# Patient Record
Sex: Female | Born: 1975 | Race: White | Hispanic: No | Marital: Married | Smoking: Never smoker
Health system: Southern US, Community
[De-identification: ages and names within clinical notes are randomized; demographics above are authoritative.]

## PROBLEM LIST (undated history)

## (undated) DIAGNOSIS — C50919 Malignant neoplasm of unspecified site of unspecified female breast: Secondary | ICD-10-CM

## (undated) DIAGNOSIS — J45909 Unspecified asthma, uncomplicated: Secondary | ICD-10-CM

## (undated) DIAGNOSIS — C801 Malignant (primary) neoplasm, unspecified: Secondary | ICD-10-CM

## (undated) DIAGNOSIS — Z800 Family history of malignant neoplasm of digestive organs: Secondary | ICD-10-CM

## (undated) DIAGNOSIS — J189 Pneumonia, unspecified organism: Secondary | ICD-10-CM

## (undated) DIAGNOSIS — Z808 Family history of malignant neoplasm of other organs or systems: Secondary | ICD-10-CM

## (undated) DIAGNOSIS — Z803 Family history of malignant neoplasm of breast: Secondary | ICD-10-CM

## (undated) HISTORY — DX: Family history of malignant neoplasm of breast: Z80.3

## (undated) HISTORY — DX: Family history of malignant neoplasm of digestive organs: Z80.0

## (undated) HISTORY — DX: Family history of malignant neoplasm of other organs or systems: Z80.8

## (undated) HISTORY — PX: WISDOM TOOTH EXTRACTION: SHX21

---

## 2001-10-02 HISTORY — PX: APPENDECTOMY: SHX54

## 2015-11-04 ENCOUNTER — Other Ambulatory Visit: Payer: Self-pay

## 2015-11-04 DIAGNOSIS — Z1231 Encounter for screening mammogram for malignant neoplasm of breast: Secondary | ICD-10-CM

## 2015-11-26 ENCOUNTER — Ambulatory Visit
Admission: RE | Admit: 2015-11-26 | Discharge: 2015-11-26 | Disposition: A | Discharge status: Home / Self Care | Payer: Managed Care, Other (non HMO) | Source: Ambulatory Visit

## 2015-11-26 DIAGNOSIS — Z1231 Encounter for screening mammogram for malignant neoplasm of breast: Secondary | ICD-10-CM

## 2016-10-02 HISTORY — PX: AUGMENTATION MAMMAPLASTY: SUR837

## 2016-10-02 HISTORY — PX: MASTECTOMY: SHX3

## 2017-02-21 ENCOUNTER — Other Ambulatory Visit: Payer: Self-pay | Admitting: Family Medicine

## 2017-02-21 DIAGNOSIS — Z1231 Encounter for screening mammogram for malignant neoplasm of breast: Secondary | ICD-10-CM

## 2017-03-12 ENCOUNTER — Ambulatory Visit: Payer: Managed Care, Other (non HMO)

## 2017-03-30 ENCOUNTER — Ambulatory Visit
Admission: RE | Admit: 2017-03-30 | Discharge: 2017-03-30 | Disposition: A | Discharge status: Home / Self Care | Payer: Managed Care, Other (non HMO) | Source: Ambulatory Visit | Attending: Family Medicine | Admitting: Family Medicine

## 2017-03-30 DIAGNOSIS — Z1231 Encounter for screening mammogram for malignant neoplasm of breast: Secondary | ICD-10-CM

## 2017-04-03 ENCOUNTER — Other Ambulatory Visit: Payer: Self-pay | Admitting: Family Medicine

## 2017-04-03 DIAGNOSIS — R928 Other abnormal and inconclusive findings on diagnostic imaging of breast: Secondary | ICD-10-CM

## 2017-04-09 ENCOUNTER — Other Ambulatory Visit: Payer: Self-pay | Admitting: Family Medicine

## 2017-04-09 ENCOUNTER — Ambulatory Visit
Admission: RE | Admit: 2017-04-09 | Discharge: 2017-04-09 | Disposition: A | Discharge status: Home / Self Care | Payer: Managed Care, Other (non HMO) | Source: Ambulatory Visit | Attending: Family Medicine | Admitting: Family Medicine

## 2017-04-09 DIAGNOSIS — N6320 Unspecified lump in the left breast, unspecified quadrant: Secondary | ICD-10-CM

## 2017-04-09 DIAGNOSIS — R928 Other abnormal and inconclusive findings on diagnostic imaging of breast: Secondary | ICD-10-CM

## 2017-04-10 ENCOUNTER — Ambulatory Visit
Admission: RE | Admit: 2017-04-10 | Discharge: 2017-04-10 | Disposition: A | Discharge status: Home / Self Care | Payer: Managed Care, Other (non HMO) | Source: Ambulatory Visit | Attending: Family Medicine | Admitting: Family Medicine

## 2017-04-10 ENCOUNTER — Other Ambulatory Visit: Payer: Self-pay | Admitting: Family Medicine

## 2017-04-10 DIAGNOSIS — N6320 Unspecified lump in the left breast, unspecified quadrant: Secondary | ICD-10-CM

## 2017-04-12 ENCOUNTER — Other Ambulatory Visit: Payer: Self-pay | Admitting: Family Medicine

## 2017-04-12 DIAGNOSIS — N6489 Other specified disorders of breast: Secondary | ICD-10-CM

## 2017-04-13 ENCOUNTER — Encounter: Payer: Self-pay | Admitting: Radiation Oncology

## 2017-04-13 ENCOUNTER — Other Ambulatory Visit: Payer: Managed Care, Other (non HMO)

## 2017-04-13 ENCOUNTER — Ambulatory Visit
Admission: RE | Admit: 2017-04-13 | Discharge: 2017-04-13 | Disposition: A | Discharge status: Home / Self Care | Payer: Managed Care, Other (non HMO) | Source: Ambulatory Visit | Attending: Radiation Oncology | Admitting: Radiation Oncology

## 2017-04-13 ENCOUNTER — Ambulatory Visit (HOSPITAL_BASED_OUTPATIENT_CLINIC_OR_DEPARTMENT_OTHER): Payer: Managed Care, Other (non HMO) | Admitting: Hematology and Oncology

## 2017-04-13 ENCOUNTER — Encounter: Payer: Self-pay | Admitting: Hematology and Oncology

## 2017-04-13 ENCOUNTER — Telehealth: Payer: Self-pay | Admitting: *Deleted

## 2017-04-13 VITALS — BP 120/77 | HR 50 | Temp 98.3°F | Resp 18 | Ht 66.0 in | Wt 132.5 lb

## 2017-04-13 DIAGNOSIS — C50912 Malignant neoplasm of unspecified site of left female breast: Secondary | ICD-10-CM | POA: Insufficient documentation

## 2017-04-13 DIAGNOSIS — C50412 Malignant neoplasm of upper-outer quadrant of left female breast: Secondary | ICD-10-CM | POA: Insufficient documentation

## 2017-04-13 DIAGNOSIS — D0512 Intraductal carcinoma in situ of left breast: Secondary | ICD-10-CM

## 2017-04-13 NOTE — Assessment & Plan Note (Signed)
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 It is considered a precancerous lesion. DCIS is classified as a 0. It is generally detected through mammograms as calcifications. We discussed the significance of grades and its impact on prognosis. We also discussed the importance of ER and PR receptors and their implications to adjuvant treatment options. Prognosis of DCIS dependence on grade, comedo necrosis. It is anticipated that if not treated, 20-30% of DCIS can develop into invasive breast cancer.  Recommendation: 1. Breast conserving surgery 2. Followed by adjuvant radiation therapy 3. Followed by antiestrogen therapy with tamoxifen 5 years  Tamoxifen counseling: We discussed the risks and benefits of tamoxifen. These include but not limited to insomnia, hot flashes, mood changes, vaginal dryness, and weight gain. Although rare, serious side effects including endometrial cancer, risk of blood clots were also discussed. We strongly believe that the benefits far outweigh the risks. Patient understands these risks and consented to starting treatment. Planned treatment duration is 5 years.  Return to clinic after surgery to discuss the final pathology report and come up with an adjuvant treatment plan.

## 2017-04-13 NOTE — Progress Notes (Signed)
Radiation Oncology         [phone]) [phone] ________________________________  Initial outpatient Consultation  Name: Meagan [name] MRN: [id number]  Date: 04/13/2017  DOB: Feb 13, 1976  DT:OIZTI, Mechele Claude, MD  Nicholas Lose, MD   REFERRING PHYSICIAN: Nicholas Lose, MD  DIAGNOSIS:    ICD-10-CM   1. Malignant neoplasm of left female breast, unspecified estrogen receptor status, unspecified site of breast [facility]) C50.912 Ambulatory referral to Social Work  2. Carcinoma of upper-outer quadrant of left female breast, unspecified estrogen receptor status ([place]) C50.412     Stage 0 Left Breast DCIS, ER/PR/Her2 pending, Low Grade  CHIEF COMPLAINT: Here to discuss management of left breast cancer  HISTORY OF PRESENT ILLNESS::Meagan [name] is a 41 y.o. female who presented with screening detected abnormality. Mammogram on 03/30/17 showed two indeterminate lesions in the 12 and 12:30 regions of the left breast. Subsequent ultrasound showed a hypoechoic elongated lesion in the left breast at the 12 o'clock position, 1 cm from the nipple, measuring 9 x 1 x 5 mm. There was an additional hypoechoic lesion noted in the left breast at the 12:30 o'clock position, 2 cm from the nipple, measuring 5 x 2 x 6 mm. No adenopathy noted in the left axilla.  Biopsy of the left breast in the 12 and 12:30 positions on 04/10/17 revealed low grade DCIS.   The patient met in consultation with Dr. Lindi Adie today. She has not yet consulted with a surgeon.  The patient presents to the clinic today to discuss the role that radiation therapy may play in the treatment of her disease. She is accompanied by her husband today.  On review of systems, the patient denies pain. She denies any other concerns. The patient exercises regularly. Overall she is without complaint.  Of note, the patient reports a distant family history of breast cancer in maternal first cousins (on different sides of her mom's family); one of these cousins was  diagnosed in her 45s.Marland Kitchen Her mother had cervical cancer. Her father had melanoma in his 60s. Her grandfather had colon cancer.   PREVIOUS RADIATION THERAPY: No  PAST MEDICAL HISTORY:  has no past medical history on file.    PAST SURGICAL HISTORY: Past Surgical History:  Procedure Laterality Date  . APPENDECTOMY  10/02/2001    FAMILY HISTORY: family history is not on file.  SOCIAL HISTORY:  reports that she has never smoked. She has never used smokeless tobacco. She reports that she drinks alcohol. She reports that she does not use drugs. The patient is a non smoker. She lives in [place] with her husband. She works in Pharmac[employer].  ALLERGIES: Erythromycin  MEDICATIONS:  Current Outpatient Prescriptions  Medication Sig Dispense Refill  . Multiple Vitamins-Minerals (MULTIVITAMIN ADULT PO) multivitamin    . Saccharomyces boulardii (PROBIOTIC) 250 MG CAPS Probiotic     No current facility-administered medications for this encounter.     REVIEW OF SYSTEMS: as above   PHYSICAL EXAM:  height is '5\' 6"'$  (1.676 m) and weight is 132 lb 8 oz (60.1 kg). Her oral temperature is 98.3 F (36.8 C). Her blood pressure is 120/77 and her pulse is 50 (abnormal). Her respiration is 18 and oxygen saturation is 100%.   General: Alert and oriented, in no acute distress. HEENT: Head is normocephalic. Oropharynx and oral cavity are clear. Neck: Neck is supple, no palpable cervical or supraclavicular lymphadenopathy. Heart: Regular in  rhythm with no murmurs. Heart is slightly bradycardic. Chest: Clear to auscultation bilaterally. Abdomen: Soft, non tender, non  distended. Extremities: No edema in lower extremities. Lymphatics: see Neck Exam Skin: No concerning lesions. Musculoskeletal: symmetric strength and muscle tone throughout. Neurologic: No obvious focalities. Speech is fluent. Coordination is intact. Psychiatric: Judgment and insight are intact. Affect is appropriate. Breast: She has some subtle  bruising in the upper outer quadrant of the left breast, as well as some firmness underneath the bruising consistent with biopsy changes. No adenopathy bilaterally in axillae. No palpable masses in the right breast.  ECOG = 0   LABORATORY DATA:  No results found for: WBC, HGB, HCT, MCV, PLT CMP  No results found for: NA, K, CL, CO2, GLUCOSE, BUN, CREATININE, CALCIUM, PROT, ALBUMIN, AST, ALT, ALKPHOS, BILITOT, GFRNONAA, GFRAA     RADIOGRAPHY: US Breast Ltd Uni Left Inc Axilla  Result Date: 04/09/2017 CLINICAL DATA:  Patient was called back from screening mammogram for a left breast asymmetry. EXAM: 2D DIGITAL DIAGNOSTIC LEFT MAMMOGRAM WITH CAD AND ADJUNCT TOMO ULTRASOUND LEFT BREAST COMPARISON:  Previous exam(s). ACR Breast Density Category d: The breast tissue is extremely dense, which lowers the sensitivity of mammography. FINDINGS: Additional imaging of the left breast was performed. There is persistent asymmetry in the superior aspect of the breast. There are no malignant type microcalcifications. Mammographic images were processed with CAD. On physical exam, I do not palpate a discrete mass in the superior aspect of the left breast. Targeted ultrasound is performed, showing a hypoechoic elongated lesion in the left breast at 12 o'clock 1 cm from the nipple measuring 9 x 1 x 5 mm. There is a hypoechoic lesion in the left breast at 12:30 2 cm from the nipple measuring 5 x 2 x 6 mm. These lesions are indeterminate. Sonographic evaluation the left axilla does not show any enlarged adenopathy. IMPRESSION: Two indeterminate lesions in the 12 and 12:30 region of the left breast. RECOMMENDATION: Ultrasound-guided core biopsies of the left breast lesions is recommended. The biopsies will be scheduled at the patient's convenience. I have discussed the findings and recommendations with the patient. Results were also provided in writing at the conclusion of the visit. If applicable, a reminder letter will be sent  to the patient regarding the next appointment. BI-RADS CATEGORY  4: Suspicious. Electronically Signed   By: Lillia Mountain M.D.   On: 04/09/2017 14:29   Mm Diag Breast Tomo Uni Left  Result Date: 04/09/2017 CLINICAL DATA:  Patient was called back from screening mammogram for a left breast asymmetry. EXAM: 2D DIGITAL DIAGNOSTIC LEFT MAMMOGRAM WITH CAD AND ADJUNCT TOMO ULTRASOUND LEFT BREAST COMPARISON:  Previous exam(s). ACR Breast Density Category d: The breast tissue is extremely dense, which lowers the sensitivity of mammography. FINDINGS: Additional imaging of the left breast was performed. There is persistent asymmetry in the superior aspect of the breast. There are no malignant type microcalcifications. Mammographic images were processed with CAD. On physical exam, I do not palpate a discrete mass in the superior aspect of the left breast. Targeted ultrasound is performed, showing a hypoechoic elongated lesion in the left breast at 12 o'clock 1 cm from the nipple measuring 9 x 1 x 5 mm. There is a hypoechoic lesion in the left breast at 12:30 2 cm from the nipple measuring 5 x 2 x 6 mm. These lesions are indeterminate. Sonographic evaluation the left axilla does not show any enlarged adenopathy. IMPRESSION: Two indeterminate lesions in the 12 and 12:30 region of the left breast. RECOMMENDATION: Ultrasound-guided core biopsies of the left breast lesions is recommended. The biopsies will  be scheduled at the patient's convenience. I have discussed the findings and recommendations with the patient. Results were also provided in writing at the conclusion of the visit. If applicable, a reminder letter will be sent to the patient regarding the next appointment. BI-RADS CATEGORY  4: Suspicious. Electronically Signed   By: Baird Lyons M.D.   On: 04/09/2017 14:29   Mm Screening Breast Tomo Bilateral  Result Date: 03/30/2017 CLINICAL DATA:  Screening. EXAM: 2D DIGITAL SCREENING BILATERAL MAMMOGRAM WITH CAD AND ADJUNCT  TOMO COMPARISON:  Previous exam(s). ACR Breast Density Category d: The breast tissue is extremely dense, which lowers the sensitivity of mammography. FINDINGS: In the left breast, a possible asymmetry warrants further evaluation. In the right breast, no findings suspicious for malignancy. Images were processed with CAD. IMPRESSION: Further evaluation is suggested for possible asymmetry in the left breast. RECOMMENDATION: Diagnostic mammogram and possibly ultrasound of the left breast. (Code:[id number]) The patient will be contacted regarding the findings, and additional imaging will be scheduled. BI-RADS CATEGORY  0: Incomplete. Need additional imaging evaluation and/or prior mammograms for comparison. Electronically Signed   By: Sherian Rein M.D.   On: 03/30/2017 14:43   Mm Clip Placement Left  Result Date: 04/10/2017 CLINICAL DATA:  Evaluate biopsy clips EXAM: DIAGNOSTIC LEFT MAMMOGRAM POST ULTRASOUND BIOPSY COMPARISON:  Previous exam(s). FINDINGS: Mammographic images were obtained following ultrasound guided biopsy of 2 left breast masses. Both biopsy clips are in good position. Neither correlates with the left breast asymmetry seen on the MLO and 90 degree lateral views. IMPRESSION: The biopsy clips are in good position. The biopsy clips do not correlate with the mammographic abnormality. In retrospect, the asymmetry in the left breast has likely been stable for at least 1 year. Options of six-month follow-up versus biopsy were given to the patient. She will decide which route to pursue when called with her biopsy results. 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 This was found to be  concordant by Dr. Gerome Sam. Pathology results were discussed with the patient by telephone. The patient reported doing well after the biopsies. Post biopsy instructions and care were reviewed and questions were answered. The patient was encouraged to call [facility] for any additional concerns. The patient was referred to the [facility] at the [facility] on April 18, 2017. A LEFT breast stereotactic biopsy is scheduled for April 16, 2017. Pathology results reported by Sonnie Alamo RN, BSN on 04/12/2017. Electronically Signed   By: Gerome Sam III M.D   On: 04/13/2017 08:57   Result Date: 04/13/2017 CLINICAL DATA:  Two left breast masses. EXAM: ULTRASOUND GUIDED LEFT BREAST CORE NEEDLE BIOPSY COMPARISON:  Previous exam(s). FINDINGS: I met with the patient and we discussed the procedure of ultrasound-guided biopsy, including benefits and alternatives. We discussed the high likelihood of a successful procedure. We discussed the risks of the procedure, including infection, bleeding, tissue injury, clip migration, and inadequate sampling. Informed written consent was given. The usual time-out protocol was performed immediately prior to the procedure. Lesion quadrant: Upper-outer Using sterile technique and 1% Lidocaine as local anesthetic, under direct ultrasound  visualization, a 12 gauge spring-loaded device was used to perform biopsy of the left breast mass at 12 o'clock using a lateral approach. At the conclusion of the procedure a ribbon shaped tissue marker clip was deployed into the biopsy cavity. Using sterile technique and 1% Lidocaine as local anesthetic, under direct ultrasound visualization, a 12 gauge spring-loaded device was used to perform biopsy of the left breast mass at 12:30 using a lateral approach. At the conclusion of the procedure a coil shaped tissue marker clip was deployed into the biopsy cavity. Follow up 2  view mammogram was performed and dictated separately. Follow up 2 view mammogram was performed and dictated separately. IMPRESSION: Ultrasound guided biopsy of 2 left breast masses as above. No apparent complications. 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 This was found to be concordant by Dr. Gerome Sam. Pathology results were discussed with the patient by telephone. The patient reported doing well after the biopsies. Post biopsy instructions and care were reviewed and questions were answered. The patient was encouraged to call [facility] for any additional concerns. The patient was referred to the [facility] at the [facility] on April 18, 2017. A LEFT breast stereotactic biopsy is scheduled for April 16, 2017. Pathology results reported by Sonnie Alamo RN, BSN on 04/12/2017. Electronically Signed   By: Gerome Sam III M.D   On: 04/13/2017 08:57   Result Date: 04/13/2017 CLINICAL DATA:  Two left breast masses. EXAM: ULTRASOUND GUIDED LEFT BREAST CORE NEEDLE BIOPSY COMPARISON:  Previous exam(s). FINDINGS: I met with the patient and we discussed the procedure of ultrasound-guided biopsy, including benefits and alternatives. We discussed the high likelihood of a successful procedure. We discussed the risks of the procedure, including infection, bleeding, tissue injury, clip migration, and inadequate sampling. Informed written consent was given. The usual time-out protocol was performed immediately prior to the procedure. Lesion quadrant: Upper-outer Using sterile technique and 1% Lidocaine as local anesthetic, under direct ultrasound visualization, a 12 gauge  spring-loaded device was used to perform biopsy of the left breast mass at 12 o'clock using a lateral approach. At the conclusion of the procedure a ribbon shaped tissue marker clip was deployed into the biopsy cavity. Using sterile technique and 1% Lidocaine as local anesthetic, under direct ultrasound visualization, a 12 gauge spring-loaded device was used to perform biopsy of the left breast mass at 12:30 using a lateral approach. At the conclusion of the procedure a coil shaped tissue marker clip was deployed into the biopsy cavity. Follow up 2 view mammogram was performed and dictated separately. Follow up 2 view mammogram was performed and dictated separately. IMPRESSION: Ultrasound guided biopsy of 2 left breast masses as above. No apparent complications. Electronically Signed: By: Gerome Sam III M.D On: 04/10/2017 17:26      IMPRESSION/PLAN: Layni Kreamer is a delightful patient with screening detected left breast low grade DCIS. She is not interested in the COMET trial. She met with med/onc. She would like to attempt breast conservation.  We discussed radiotherapy today.  I would recommend radiotherapy to the left breast  in order to reduce chance of locoregional recurrence by 1/2.  The risks, benefits  and side effects of this treatment were discussed in detail.  She understands that radiotherapy is associated with skin irritation and fatigue in the acute setting. Late effects can include cosmetic changes and rare injury to internal organs.   She is enthusiastic about proceeding with treatment. No guarantees of treatment were given. A consent form has been signed and placed in her chart. I will await post operative referral back to this clinic to proceed with CT simulation and treatment planning.  This patient is likely a good candidate for breast conserving surgery and 4 weeks of adjuvant radiotherapy. She will proceed with surgical consult next week as scheduled.  We briefly discussed the  patient's family history. She may be a candidate for genetic testing. The patient voices understanding. This will be discussed at tumor board with a genetics counselor.  Additionally, as the patient already enjoys exercising, I encouraged the patient to remain active as she is able. I advised her that this will help maintain her energy levels and provide some normalcy during and after treatment.  I spent 40 minutes face to face with the patient and more than 50% of that time was spent in counseling and/or coordination of care. __________________________________________   Eppie Gibson, MD  This document serves as a record of services personally performed by Eppie Gibson, MD. It was created on her behalf by Maryla Morrow, a trained medical scribe. The creation of this record is based on the scribe's personal observations and the provider's statements to them. This document has been checked and approved by the attending provider.

## 2017-04-13 NOTE — Progress Notes (Signed)
Location of Breast Cancer: Left Breast  Histology per Pathology Report:  04/10/17 Diagnosis 1. Breast, left, needle core biopsy, 12:00 o'clock - DUCTAL CARCINOMA IN SITU. 2. Breast, left, needle core biopsy, 12:30 o'clock - DUCTAL CARCINOMA IN SITU.  Receptor Status: ER(), PR (), Her2-neu (), Ki-()  Did patient present with symptoms or was this found on screening mammography?: screening mammography  Past/Anticipated interventions by surgeon, if any:Has not seen a surgeon  Past/Anticipated interventions by medical oncology, if any: 04-13-17 Dr. Lindi Adie   Lymphedema issues, if any: No   Pain issues, if any: No  SAFETY ISSUES:  Prior radiation? No  Pacemaker/ICD? No  Possible current pregnancy? No  Is the patient on methotrexate? No  Current Complaints / other details: 41 y.o. married woman with two children,has family  History of breast cancer cousins  Menarche 1, G3,P2, BC yes,  Wt Readings from Last 3 Encounters:  04/13/17 132 lb 8 oz (60.1 kg)  04/13/17 132 lb 8 oz (60.1 kg)   BP 120/77   Pulse (!) 50   Temp 98.3 F (36.8 C) (Oral)   Resp 18   Ht _0  (1.676 m)   Wt 132 lb 8 oz (60.1 kg)   SpO2 100%   BMI 21.39 kg/m    Mal[place]elt, Stephani Police, RN 04/13/2017,12:52 PM

## 2017-04-13 NOTE — Progress Notes (Signed)
[place] CONSULT NOTE  Patient Care Team: Fanny Bien, MD as PCP - General (Family Medicine)  CHIEF COMPLAINTS/PURPOSE OF CONSULTATION:  Newly diagnosed left breast DCIS  HISTORY OF PRESENTING ILLNESS:  Meagan [name] 41 y.o. female is here because of recent diagnosis of left breast DCIS. Patient had a routine screening mammogram which detected abnormality in the left breast at 12 and 12:30 positions. This was further evaluated by ultrasound and additional mammograms. Biopsy of this was performed on 04/10/2017. The biopsy results revealed low-grade DCIS. Patient is scheduled to undergo another biopsy on Monday. She is here today accompanied by her husband to discuss a treatment plan. She appears to be extremely anxious and worried about it.  I reviewed her records extensively and collaborated the history with the patient.  SUMMARY OF ONCOLOGIC HISTORY:   Ductal carcinoma in situ (DCIS) of left breast   04/10/2017 Initial Diagnosis    Left breast biopsy 12:00 and 12:30 position: DCIS, grade 1; screening mammogram detected a 9 mm and a 5 mm abnormality in the left breast      MEDICAL HISTORY:  No past medical problems  SURGICAL HISTORY: Past Surgical History:  Procedure Laterality Date  . APPENDECTOMY  10/02/2001    SOCIAL HISTORY: Social History   Social History  . Marital status: Married    Spouse name: N/A  . Number of children: N/A  . Years of education: N/A   Occupational History  . Not on file.   Social History Main Topics  . Smoking status: Never Smoker  . Smokeless tobacco: Never Used  . Alcohol use Yes  . Drug use: No  . Sexual activity: Yes   Other Topics Concern  . Not on file   Social History Narrative  . No narrative on file    FAMILY HISTORY: Family History  Problem Relation Age of Onset  . Breast cancer Neg Hx     ALLERGIES:  is allergic to erythromycin.  MEDICATIONS:  Current Outpatient Prescriptions  Medication Sig  Dispense Refill  . Multiple Vitamins-Minerals (MULTIVITAMIN ADULT PO) multivitamin    . Saccharomyces boulardii (PROBIOTIC) 250 MG CAPS Probiotic     No current facility-administered medications for this visit.     REVIEW OF SYSTEMS:   Constitutional: Denies fevers, chills or abnormal night sweats Eyes: Denies blurriness of vision, double vision or watery eyes Ears, nose, mouth, throat, and face: Denies mucositis or sore throat Respiratory: Denies cough, dyspnea or wheezes Cardiovascular: Denies palpitation, chest discomfort or lower extremity swelling Gastrointestinal:  Denies nausea, heartburn or change in bowel habits Skin: Denies abnormal skin rashes Lymphatics: Denies new lymphadenopathy or easy bruising Neurological:Denies numbness, tingling or new weaknesses Behavioral/Psych: Mood is stable, no new changes  Breast:  Denies any palpable lumps or discharge All other systems were reviewed with the patient and are negative.  PHYSICAL EXAMINATION: ECOG PERFORMANCE STATUS: 0 - Asymptomatic  Vitals:   04/13/17 1408  BP: 120/77  Pulse: (!) 50  Resp: 18  Temp: 98.3 F (36.8 C)   Filed Weights   04/13/17 1408  Weight: 132 lb 8 oz (60.1 kg)    GENERAL:alert, no distress and comfortable SKIN: skin color, texture, turgor are normal, no rashes or significant lesions EYES: normal, conjunctiva are pink and non-injected, sclera clear OROPHARYNX:no exudate, no erythema and lips, buccal mucosa, and tongue normal  NECK: supple, thyroid normal size, non-tender, without nodularity LYMPH:  no palpable lymphadenopathy in the cervical, axillary or inguinal LUNGS: clear to auscultation and  percussion with normal breathing effort HEART: regular rate & rhythm and no murmurs and no lower extremity edema ABDOMEN:abdomen soft, non-tender and normal bowel sounds Musculoskeletal:no cyanosis of digits and no clubbing  PSYCH: alert & oriented x 3 with fluent speech NEURO: no focal motor/sensory  deficits BREAST: No palpable nodules in breast. No palpable axillary or supraclavicular lymphadenopathy (exam performed in the presence of a chaperone)   ASSESSMENT AND PLAN:  Ductal carcinoma in situ (DCIS) of left breast 04/10/2017: Left breast biopsy 12:00 and 12:30 position: DCIS, grade 1; screening mammogram detected a 9 mm and a 5 mm abnormality in the left breast ER and PR pending  Pathology review: I discussed with the patient the difference between DCIS and invasive breast cancer. It is considered a precancerous lesion. DCIS is classified as a 0. It is generally detected through mammograms as calcifications. We discussed the significance of grades and its impact on prognosis. We also discussed the importance of ER and PR receptors and their implications to adjuvant treatment options. Prognosis of DCIS dependence on grade, comedo necrosis. It is anticipated that if not treated, 20-30% of DCIS can develop into invasive breast cancer.  Recommendation: 1. Breast conserving surgery 2. Followed by adjuvant radiation therapy 3. Followed by antiestrogen therapy with tamoxifen 5 years  Tamoxifen counseling: We discussed the risks and benefits of tamoxifen. These include but not limited to insomnia, hot flashes, mood changes, vaginal dryness, and weight gain. Although rare, serious side effects including endometrial cancer, risk of blood clots were also discussed. We strongly believe that the benefits far outweigh the risks. Patient understands these risks and consented to starting treatment. Planned treatment duration is 5 years.  I discussed with the patient briefly the clinical trial COMET. Based on her young age, we decided not to pursue a trial  Return to clinic after surgery to discuss the final pathology report and come up with an adjuvant treatment plan.   All questions were answered. The patient knows to call the clinic with any problems, questions or concerns.    Rulon Eisenmenger,  MD 04/13/17

## 2017-04-13 NOTE — Telephone Encounter (Signed)
Spoke with patient to schedule for [facility].  She tells me she has a 2 week vacation planned starting next week.  Offered her an appointment with Dr. Lindi Adie today and she agreed.  Confirmed appointment for today 04/13/17 at 2pm.  I am working on getting her in with surgeon.

## 2017-04-16 ENCOUNTER — Ambulatory Visit
Admission: RE | Admit: 2017-04-16 | Discharge: 2017-04-16 | Disposition: A | Discharge status: Home / Self Care | Payer: Managed Care, Other (non HMO) | Source: Ambulatory Visit | Attending: Family Medicine | Admitting: Family Medicine

## 2017-04-16 ENCOUNTER — Telehealth: Payer: Self-pay | Admitting: *Deleted

## 2017-04-16 DIAGNOSIS — N6489 Other specified disorders of breast: Secondary | ICD-10-CM

## 2017-04-16 NOTE — Telephone Encounter (Signed)
"  I was in last week and would like to know if t he hormone results are in yet.  Will I need Tamoxifen?"   Results still pending.  No further questions.

## 2017-04-17 ENCOUNTER — Other Ambulatory Visit: Payer: Self-pay | Admitting: Surgery

## 2017-04-17 ENCOUNTER — Telehealth: Payer: Self-pay | Admitting: *Deleted

## 2017-04-17 NOTE — Telephone Encounter (Signed)
Called pt with prognostic panel. Discussed after xrt she will take Tamoxifen. Discussed we will wait for genetic results to return before surgery is scheduled d/t possible change in sx pending genetic results. Received verbal understanding.

## 2017-04-19 NOTE — Addendum Note (Signed)
Encounter addended by: Melissa Pulido, Stephani Police, RN on: 04/19/2017 10:32 AM<BR>    Actions taken: Charge Capture section accepted

## 2017-04-20 ENCOUNTER — Encounter: Payer: Self-pay | Admitting: *Deleted

## 2017-04-24 ENCOUNTER — Telehealth: Payer: Self-pay | Admitting: *Deleted

## 2017-04-24 ENCOUNTER — Other Ambulatory Visit: Payer: Self-pay | Admitting: Surgery

## 2017-04-24 DIAGNOSIS — D0512 Intraductal carcinoma in situ of left breast: Secondary | ICD-10-CM

## 2017-04-24 NOTE — Telephone Encounter (Signed)
Spoke with patient and confirmed genetic appointment to discuss results for 7/26 at 1pm.

## 2017-04-26 ENCOUNTER — Ambulatory Visit (HOSPITAL_BASED_OUTPATIENT_CLINIC_OR_DEPARTMENT_OTHER): Payer: Managed Care, Other (non HMO) | Admitting: Genetic Counselor

## 2017-04-26 ENCOUNTER — Ambulatory Visit
Admission: RE | Admit: 2017-04-26 | Discharge: 2017-04-26 | Disposition: A | Discharge status: Home / Self Care | Payer: Managed Care, Other (non HMO) | Source: Ambulatory Visit | Attending: Surgery | Admitting: Surgery

## 2017-04-26 ENCOUNTER — Encounter: Payer: Self-pay | Admitting: Genetic Counselor

## 2017-04-26 ENCOUNTER — Other Ambulatory Visit: Payer: Self-pay | Admitting: Surgery

## 2017-04-26 DIAGNOSIS — D0512 Intraductal carcinoma in situ of left breast: Secondary | ICD-10-CM

## 2017-04-26 DIAGNOSIS — Z803 Family history of malignant neoplasm of breast: Secondary | ICD-10-CM

## 2017-04-26 DIAGNOSIS — Z808 Family history of malignant neoplasm of other organs or systems: Secondary | ICD-10-CM

## 2017-04-26 DIAGNOSIS — Z315 Encounter for genetic counseling: Secondary | ICD-10-CM

## 2017-04-26 DIAGNOSIS — Z800 Family history of malignant neoplasm of digestive organs: Secondary | ICD-10-CM | POA: Insufficient documentation

## 2017-04-26 DIAGNOSIS — Z1379 Encounter for other screening for genetic and chromosomal anomalies: Secondary | ICD-10-CM | POA: Insufficient documentation

## 2017-04-26 MED ORDER — GADOBENATE DIMEGLUMINE 529 MG/ML IV SOLN
12.0000 mL | Freq: Once | INTRAVENOUS | Status: AC | PRN
  Administered 2017-04-26: 12 mL via INTRAVENOUS

## 2017-04-26 NOTE — Progress Notes (Signed)
REFERRING PROVIDER: Nicholas Lose, MD [address] [place], [place] [address]  PRIMARY PROVIDER:  Fanny Bien, MD  PRIMARY REASON FOR VISIT:  1. Ductal carcinoma in situ (DCIS) of left breast   2. Family history of breast cancer   3. Family history of melanoma   4. Family history of colon cancer   5. Genetic testing      HISTORY OF PRESENT ILLNESS:   [name], a 41 y.o. female, was seen for a [facility] cancer genetics consultation at the request of Dr. Lindi Adie due to a personal and family history of cancer.  [name] presents to clinic today to discuss the possibility of a hereditary predisposition to cancer, genetic testing, and to further clarify her future cancer risks, as well as potential cancer risks for family members.   In July 2018, at the age of 6, [name] was diagnosed with DCIS of the left breast. This was treated with lumpectomy, radiation and tamoxifen.  She was seen in the [facility] clinic and indicated she was heading for vacation for 2 weeks.  Blood was drawn at that time for the STAT panel through [facility].  The STAT Breast cancer panel offered by [facility] includes sequencing and rearrangement analysis for the following 9 genes:  ATM, BRCA1, BRCA2, CDH1, CHEK2, PALB2, PTEN, STK11 and TP53.      CANCER HISTORY:    Ductal carcinoma in situ (DCIS) of left breast   04/10/2017 Initial Diagnosis    Left breast biopsy 12:00 and 12:30 position: DCIS, grade 1; screening mammogram detected a 9 mm and a 5 mm abnormality in the left breast      04/21/2017 Genetic Testing    BRCA2 c.5937C>G VUS identified on the STAT panel.  The STAT Breast cancer panel offered by [facility] includes sequencing and rearrangement analysis for the following 9 genes:  ATM, BRCA1, BRCA2, CDH1, CHEK2, PALB2, PTEN, STK11 and TP53.   The report date is April 21, 2017.  The remainder of the common hereditary cancer panel and the melanoma panel is pending.         HORMONAL RISK FACTORS:    Menarche was at age 71.  First live birth at age 61.  OCP use for approximately 9-10 years.  Ovaries intact: yes.  Hysterectomy: no.  Menopausal status: premenopausal.  HRT use: 0 years. Colonoscopy: no; not examined. Mammogram within the last year: yes. Number of breast biopsies: 1. Up to date with pelvic exams:  yes. Any excessive radiation exposure in the past:  no  Past Medical History:  Diagnosis Date  . Family history of breast cancer   . Family history of colon cancer   . Family history of melanoma     Past Surgical History:  Procedure Laterality Date  . APPENDECTOMY  10/02/2001    Social History   Social History  . Marital status: Married    Spouse name: N/A  . Number of children: N/A  . Years of education: N/A   Social History Main Topics  . Smoking status: Never Smoker  . Smokeless tobacco: Never Used  . Alcohol use Yes  . Drug use: No  . Sexual activity: Yes   Other Topics Concern  . None   Social History Narrative  . None     FAMILY HISTORY:  We obtained a detailed, 4-generation family history.  Significant diagnoses are listed below: Family History  Problem Relation Age of Onset  . Cervical cancer Mother 60  adenocarcioma  . Skin cancer Mother        BCC's  . Melanoma Father        dx in his 73s  . Parkinson's disease Maternal Grandmother   . Colon cancer Maternal Grandfather        dx in his 36s  . Skin cancer Paternal Grandfather   . Breast cancer Cousin        mother's maternal cousin - x2, one dx at 37, another in her 43s  . Breast cancer Cousin        mothers paternal first cousin dx in her 71s-60s, and again in her 61s with possible DCIS    The patient has two children who are cancer free.  She has two sisters, neither of whom have cancer.  Her father was diagnosed with melanoma in his 84's, and her mother had cervical cancer at 41.  Her mother has one sister who is cancer, as are her children.  The patient's maternal  grandmother died at 39 from complications of parkinson's disease, and her grandfather had colon cancer in his 33's, and died at 17.  Her grandmother had two sisters, who each had a daughter with breast cancer, one was 38 at diagnosis and the other was in her 3's.  The patient's grandfather had a brother who had a daughter with breast cancer in her 73's-60's and again more recently in her 17's.  The patient's father has two brothers, one who may have had a skin cancer.  The patient's paternal grandmother died at 58 and her grandfather died at 82.  He may have had skin cancer.  [name] is unaware of previous family history of genetic testing for hereditary cancer risks. Patient's ancestors are of [place], [place] and [place] descent. There is no reported Ashkenazi Jewish ancestry. There is no known consanguinity.  GENETIC COUNSELING ASSESSMENT: Meagan [name] is a 40 y.o. female with a personal history of DCIS and a family history of breast and colon cancer and melanoma which is somewhat suggestive of a hereditary cancer syndrome and predisposition to cancer. We, therefore, discussed and recommended the following at today's visit.   GENETIC TEST RESULTS: Genetic testing reported out on April 21, 2017 through the STAT cancer panel found no deleterious mutations.  The STAT Breast cancer panel offered by [facility] includes sequencing and rearrangement analysis for the following 9 genes:  ATM, BRCA1, BRCA2, CDH1, CHEK2, PALB2, PTEN, STK11 and TP53.   The test report has been scanned into EPIC and is located under the Molecular Pathology section of the Results Review tab.   Genetic testing did detect a Variant of Unknown Significance in the BRCA2 gene called c.5937C>G. At this time, it is unknown if this variant is associated with increased cancer risk or if this is a normal finding, but most variants such as this get reclassified to being inconsequential. It should not be used to make medical management decisions.  With time, we suspect the lab will determine the significance of this variant, if any. If we do learn more about it, we will try to contact [name] to discuss it further. However, it is important to stay in touch with [place] periodically and keep the address and phone number up to date.     DISCUSSION: We discussed that about 5-10% of breast cancer is hereditary with most cases the result of a BRCA mutation.  The patient has a BRCA2 VUS, but this is not clinically actionable.  Since she is negative for  any hereditary pathogenic mutations in actionable genes, we discussed expanding the panel to include additional genes associated with hereditary cancers, including melanoma.  This is based on the remote history of breast cancer, colon cancer and melanoma in her father. We reviewed the characteristics, features and inheritance patterns of hereditary cancer syndromes. We also discussed genetic testing, including the appropriate family members to test, the process of testing, insurance coverage and turn-around-time for results. We discussed the implications of a negative, positive and/or variant of uncertain significant result in a different gene, other than BRCA2.  Especially if she is negative for any hereditary mutations, the women in her family, including her sisters and daughter, should be screened starting 34 years younger than her diagnosis. We recommended [name] pursue genetic testing for the rest of the common hereditary gene panel and the melanoma gene panel. The Hereditary Gene Panel offered by [facility] includes sequencing and/or deletion duplication testing of the following 46 genes: APC, ATM, AXIN2, BARD1, BMPR1A, BRCA1, BRCA2, BRIP1, CDH1, CDKN2A (p14ARF), CDKN2A (p16INK4a), CHEK2, CTNNA1, DICER1, EPCAM (Deletion/duplication testing only), GREM1 (promoter region deletion/duplication testing only), KIT, MEN1, MLH1, MSH2, MSH3, MSH6, MUTYH, NBN, NF1, NHTL1, PALB2, PDGFRA, PMS2, POLD1, POLE, PTEN, RAD50,  RAD51C, RAD51D, SDHB, SDHC, SDHD, SMAD4, SMARCA4. STK11, TP53, TSC1, TSC2, and VHL.  The following genes were evaluated for sequence changes only: SDHA and HOXB13 c.251G>A variant only.  The Melanoma panel offered by [facility] includes sequencing and/or deletion duplication testing of the following 12 genes: BAP1, BRCA1, BRCA2, BRIP1, CDK4, CDKN2A (p14ARF), CDKN2A (p16INK4a), MC1R, POT1, PTEN, RB1, TERT, and TP53.  The following gene was evaluated for sequence changes only: MITF (c.952G>A, p.GLU318Lys variant only).   PLAN: After considering the risks, benefits, and limitations, [name]  provided informed consent to pursue genetic testing and the blood sample was sent to [facility] for analysis of the rest of the common hereditary cancer panel and the melanoma gene panel. Results should be available within approximately 7-10 days' time, at which point they will be disclosed by telephone to [name], as will any additional recommendations warranted by these results. [name] will receive a summary of her genetic counseling visit and a copy of her results once available. This information will also be available in Epic. We encouraged [name] to remain in contact with cancer genetics annually so that we can continuously update the family history and inform her of any changes in cancer genetics and testing that may be of benefit for her family. [name] questions were answered to her satisfaction today. Our contact information was provided should additional questions or concerns arise.  Lastly, we encouraged [name] to remain in contact with cancer genetics annually so that we can continuously update the family history and inform her of any changes in cancer genetics and testing that may be of benefit for this family.   Ms.  Meagan [name] questions were answered to her satisfaction today. Our contact information was provided should additional questions or concerns arise. Thank you for the referral and  allowing [place] to share in the care of your patient.   Karen P. Florene Glen, [place], [facility] Certified Genetic Counselor [email or web address] phone: [phone]  The patient was seen for a total of 50 minutes in face-to-face genetic counseling.  This patient was discussed with Drs. Magrinat, Lindi Adie and/or Burr Medico who agrees with the above.    _______________________________________________________________________ For Office Staff:  Number of people involved in session: 2 Was an Intern/ student involved with case: no

## 2017-04-30 ENCOUNTER — Telehealth: Payer: Self-pay | Admitting: Genetic Counselor

## 2017-04-30 ENCOUNTER — Ambulatory Visit: Payer: Self-pay | Admitting: Genetic Counselor

## 2017-04-30 ENCOUNTER — Other Ambulatory Visit: Payer: Self-pay | Admitting: Surgery

## 2017-04-30 DIAGNOSIS — Z803 Family history of malignant neoplasm of breast: Secondary | ICD-10-CM

## 2017-04-30 DIAGNOSIS — D0512 Intraductal carcinoma in situ of left breast: Secondary | ICD-10-CM

## 2017-04-30 DIAGNOSIS — Z800 Family history of malignant neoplasm of digestive organs: Secondary | ICD-10-CM

## 2017-04-30 DIAGNOSIS — Z1379 Encounter for other screening for genetic and chromosomal anomalies: Secondary | ICD-10-CM

## 2017-04-30 DIAGNOSIS — Z808 Family history of malignant neoplasm of other organs or systems: Secondary | ICD-10-CM

## 2017-04-30 NOTE — Telephone Encounter (Signed)
Revealed negative genetic testing.  Discussed that we do not know why she has breast cancer or why there is cancer in the family. It could be due to a different gene that we are not testing, or maybe our current technology may not be able to pick something up.  It will be important for her to keep in contact with genetics to keep up with whether additional testing may be needed.  Discussed that we do not know why there is cancer in the family.  The only thing that showed up was a VUS in [place].  This is NOT clinically actionable.  We will let her know once this is reclassified.

## 2017-04-30 NOTE — Progress Notes (Signed)
HPI: Ms. Boroff was previously seen in the [facility] due to a personal and family history of cancer and concerns regarding a hereditary predisposition to cancer. Please refer to our prior cancer genetics clinic note for more information regarding Ms. Mikowski's medical, social and family histories, and our assessment and recommendations, at the time. Ms. Sequeira recent genetic test results were disclosed to her, as were recommendations warranted by these results. These results and recommendations are discussed in more detail below.  CANCER HISTORY:    Ductal carcinoma in situ (DCIS) of left breast   04/10/2017 Initial Diagnosis    Left breast biopsy 12:00 and 12:30 position: DCIS, grade 1; screening mammogram detected a 9 mm and a 5 mm abnormality in the left breast      04/21/2017 Genetic Testing    BRCA2 c.5937C>G VUS identified on the STAT panel.  The STAT Breast cancer panel offered by [facility] includes sequencing and rearrangement analysis for the following 9 genes:  ATM, BRCA1, BRCA2, CDH1, CHEK2, PALB2, PTEN, STK11 and TP53.   The report date is April 21, 2017.    Testing was reflexed to the larger common hereditary cancer panel and the melanoma cancer panel. This testing was NEGATIVE.  The Hereditary Gene Panel offered by [facility] includes sequencing and/or deletion duplication testing of the following 46 genes: APC, ATM, AXIN2, BARD1, BMPR1A, BRCA1, BRCA2, BRIP1, CDH1, CDKN2A (p14ARF), CDKN2A (p16INK4a), CHEK2, CTNNA1, DICER1, EPCAM (Deletion/duplication testing only), GREM1 (promoter region deletion/duplication testing only), KIT, MEN1, MLH1, MSH2, MSH3, MSH6, MUTYH, NBN, NF1, NHTL1, PALB2, PDGFRA, PMS2, POLD1, POLE, PTEN, RAD50, RAD51C, RAD51D, SDHB, SDHC, SDHD, SMAD4, SMARCA4. STK11, TP53, TSC1, TSC2, and VHL.  The following genes were evaluated for sequence changes only: SDHA and HOXB13 c.251G>A variant only.  The Melanoma panel offered by [facility] includes sequencing and/or  deletion duplication testing of the following 12 genes: BAP1, BRCA1, BRCA2, BRIP1, CDK4, CDKN2A (p14ARF), CDKN2A (p16INK4a), MC1R, POT1, PTEN, RB1, TERT, and TP53.  The following gene was evaluated for sequence changes only: MITF (c.952G>A, p.GLU318Lys variant only).   The report date is April 29, 2017.         FAMILY HISTORY:  We obtained a detailed, 4-generation family history.  Significant diagnoses are listed below: Family History  Problem Relation Age of Onset  . Cervical cancer Mother 74       adenocarcioma  . Skin cancer Mother        BCC's  . Melanoma Father        dx in his 60s  . Parkinson's disease Maternal Grandmother   . Colon cancer Maternal Grandfather        dx in his 65s  . Skin cancer Paternal Grandfather   . Breast cancer Cousin        mother's maternal cousin - x2, one dx at 19, another in her 63s  . Breast cancer Cousin        mothers paternal first cousin dx in her 31s-60s, and again in her 80s with possible DCIS    The patient has two children who are cancer free.  She has two sisters, neither of whom have cancer.  Her father was diagnosed with melanoma in his 68's, and her mother had cervical cancer at 38.  Her mother has one sister who is cancer, as are her children.  The patient's maternal grandmother died at 44 from complications of parkinson's disease, and her grandfather had colon cancer in his 61's, and died at 22.  Her  grandmother had two sisters, who each had a daughter with breast cancer, one was 2 at diagnosis and the other was in her 14's.  The patient's grandfather had a brother who had a daughter with breast cancer in her 82's-60's and again more recently in her 75's.  The patient's father has two brothers, one who may have had a skin cancer.  The patient's paternal grandmother died at 70 and her grandfather died at 83.  He may have had skin cancer.  Ms. Guereca is unaware of previous family history of genetic testing for hereditary cancer risks.  Patient's ancestors are of [place], [place] and [place] descent. There is no reported Ashkenazi Jewish ancestry. There is no known consanguinity.  GENETIC TEST RESULTS: Genetic testing reported out on April 29, 2017 through the common hereditary cancer panel the melanoma cancer panel found no deleterious mutations.  The Hereditary Gene Panel offered by [facility] includes sequencing and/or deletion duplication testing of the following 46 genes: APC, ATM, AXIN2, BARD1, BMPR1A, BRCA1, BRCA2, BRIP1, CDH1, CDKN2A (p14ARF), CDKN2A (p16INK4a), CHEK2, CTNNA1, DICER1, EPCAM (Deletion/duplication testing only), GREM1 (promoter region deletion/duplication testing only), KIT, MEN1, MLH1, MSH2, MSH3, MSH6, MUTYH, NBN, NF1, NHTL1, PALB2, PDGFRA, PMS2, POLD1, POLE, PTEN, RAD50, RAD51C, RAD51D, SDHB, SDHC, SDHD, SMAD4, SMARCA4. STK11, TP53, TSC1, TSC2, and VHL.  The following genes were evaluated for sequence changes only: SDHA and HOXB13 c.251G>A variant only.  The Melanoma panel offered by [facility] includes sequencing and/or deletion duplication testing of the following 12 genes: BAP1, BRCA1, BRCA2, BRIP1, CDK4, CDKN2A (p14ARF), CDKN2A (p16INK4a), MC1R, POT1, PTEN, RB1, TERT, and TP53.  The following gene was evaluated for sequence changes only: MITF (c.952G>A, p.GLU318Lys variant only).   The test report has been scanned into EPIC and is located under the Molecular Pathology section of the Results Review tab.   We discussed with Ms. Stratton that since the current genetic testing is not perfect, it is possible there may be a gene mutation in one of these genes that current testing cannot detect, but that chance is small. We also discussed, that it is possible that another gene that has not yet been discovered, or that we have not yet tested, is responsible for the cancer diagnoses in the family, and it is, therefore, important to remain in touch with cancer genetics in the future so that we can continue to offer Ms. Batty the most  up to date genetic testing.   Genetic testing did detect a Variant of Unknown Significance in the BRCA2 gene called c.5937C>G. At this time, it is unknown if this variant is associated with increased cancer risk or if this is a normal finding, but most variants such as this get reclassified to being inconsequential. It should not be used to make medical management decisions. With time, we suspect the lab will determine the significance of this variant, if any. If we do learn more about it, we will try to contact Ms. Blanks to discuss it further. However, it is important to stay in touch with [place] periodically and keep the address and phone number up to date.     CANCER SCREENING RECOMMENDATIONS: This result is reassuring and indicates that Ms. Clay likely does not have an increased risk for a future cancer due to a mutation in one of these genes. This normal test also suggests that Ms. Douthit's cancer was most likely not due to an inherited predisposition associated with one of these genes.  Most cancers happen by chance and this negative test suggests  that her cancer falls into this category.  We, therefore, recommended she continue to follow the cancer management and screening guidelines provided by her oncology and primary healthcare provider.   RECOMMENDATIONS FOR FAMILY MEMBERS: Women in this family might be at some increased risk of developing cancer, over the general population risk, simply due to the family history of cancer. We recommended women in this family have a yearly mammogram beginning at age 75, or 18 years younger than the earliest onset of cancer, an annual clinical breast exam, and perform monthly breast self-exams. Women in this family should also have a gynecological exam as recommended by their primary provider. All family members should have a colonoscopy by age 51.  FOLLOW-UP: Lastly, we discussed with Ms. Peterkin that cancer genetics is a rapidly advancing field and it is possible  that new genetic tests will be appropriate for her and/or her family members in the future. We encouraged her to remain in contact with cancer genetics on an annual basis so we can update her personal and family histories and let her know of advances in cancer genetics that may benefit this family.   Our contact number was provided. Ms. Posey questions were answered to her satisfaction, and she knows she is welcome to call us at anytime with additional questions or concerns.   Roma Kayser, MS, [facility] Certified Genetic Counselor [email or web address]

## 2017-05-01 ENCOUNTER — Telehealth: Payer: Self-pay | Admitting: *Deleted

## 2017-05-01 NOTE — Telephone Encounter (Signed)
Spoke with patient regarding some questions she has. She is still undecided about what to do regarding sx and treatment.  Her surgery for now is scheduled for 8/16.  She is wanting a second opinion.  I have sent a referral to [facility] and her appointment is 8/9.

## 2017-05-02 DIAGNOSIS — C801 Malignant (primary) neoplasm, unspecified: Secondary | ICD-10-CM

## 2017-05-02 HISTORY — DX: Malignant (primary) neoplasm, unspecified: C80.1

## 2017-05-03 ENCOUNTER — Encounter: Payer: Self-pay | Admitting: *Deleted

## 2017-05-03 NOTE — Progress Notes (Signed)
[place] Psychosocial Distress Screening Clinical Social Work  Clinical Social Work was referred by distress screening protocol.  The patient scored a 5 on the Psychosocial Distress Thermometer which indicates moderate distress. Clinical Social Worker reviewed chart and phoned pt to assess for distress and other psychosocial needs. CSW introduced self, explained role of CSW/Pt and Family Support Team, support groups and other resources to assist. Pt shared common emotions of anxiety and fear of the unknown. CSW provided education around common emotions, coping over the cancer continuum. CSW explained in detail resources/programs to assist and additional coping tools. CSW mailing pt info about discussed resources and she plans to reach out as needed.    ONCBCN DISTRESS SCREENING 04/13/2017  Screening Type Initial Screening  Distress experienced in past week (1-10) 5  Emotional problem type Adjusting to illness  Physical Problem type Loss of appetitie    Clinical Social Worker follow up needed: Yes.    If yes, follow up plan: See above Loren Racer, LCSW, OSW-C Clinical Social Worker [place]  [facility] Phone: [phone] Fax: [phone]

## 2017-05-11 ENCOUNTER — Inpatient Hospital Stay (HOSPITAL_COMMUNITY): Admission: RE | Admit: 2017-05-11 | Payer: Managed Care, Other (non HMO) | Source: Ambulatory Visit

## 2017-05-12 NOTE — Pre-Procedure Instructions (Signed)
Meagan [name]  05/12/2017      [facility] - [place], [place] - [address] [place] [address] Phone: [phone] Fax: [phone]    Your procedure is scheduled on May 17, 2017.  Report to [facility] Admitting at 11:30 A.M.  Call this number if you have problems the morning of surgery:  [phone]   Remember:  Do not eat food or drink liquids after midnight.  Take these medicines the morning of surgery with A SIP OF WATER : nothing  Please complete your 8oz of Boost Breeze that was given to you at your preadmission appointment by 9:30 on the day of your surgery.  7 days prior to surgery STOP taking any Aspirin, Aleve, Naproxen, Ibuprofen, Motrin, Advil, Goody's, BC's, all herbal medications, fish oil, and all vitamins.   Do not wear jewelry, make-up or nail polish.  Do not wear lotions, powders, or perfumes, or deoderant.  Do not shave 48 hours prior to surgery.    Do not bring valuables to the hospital.   [facility] is not responsible for any belongings or valuables.  Contacts, dentures or bridgework may not be worn into surgery.  Leave your suitcase in the car.  After surgery it may be brought to your room.  For patients admitted to the hospital, discharge time will be determined by your treatment team.  Patients discharged the day of surgery will not be allowed to drive home.    Special instructions:   [place]- Preparing For Surgery  Before surgery, you can play an important role. Because skin is not sterile, your skin needs to be as free of germs as possible. You can reduce the number of germs on your skin by washing with CHG (chlorahexidine gluconate) Soap before surgery.  CHG is an antiseptic cleaner which kills germs and bonds with the skin to continue killing germs even after washing.  Please do not use if you have an allergy to CHG or antibacterial soaps.  If your skin becomes reddened/irritated stop using the CHG.  Do not shave (including legs and underarms) for at least 48 hours prior to first CHG shower. It is OK to shave your face.  Please follow these instructions carefully.   1. Shower the NIGHT BEFORE SURGERY and the MORNING OF SURGERY with CHG.   2. If you chose to wash your hair, wash your hair first as usual with your normal shampoo.  3. After you shampoo, rinse your hair and body thoroughly to remove the shampoo.  4. Use CHG as you would any other liquid soap. You can apply CHG directly to the skin and wash gently with a scrungie or a clean washcloth.   5. Apply the CHG Soap to your body ONLY FROM THE NECK DOWN.  Do not use on open wounds or open sores. Avoid contact with your eyes, ears, mouth and genitals (private parts). Wash genitals (private parts) with your normal soap.  6. Wash thoroughly, paying special attention to the area where your surgery will be performed.  7. Thoroughly rinse your body with warm water from the neck down.  8. DO NOT shower/wash with your normal soap after using and rinsing off the CHG Soap.  9. Pat yourself dry with a CLEAN TOWEL.   10. Wear CLEAN PAJAMAS   11. Place CLEAN SHEETS on your bed the night of your first shower and DO NOT SLEEP  WITH PETS.    Day of Surgery: Do not apply any deodorants/lotions. Please wear clean clothes to the hospital/surgery center.      Please read over the following fact sheets that you were given. Pain Booklet, Coughing and Deep Breathing and Surgical Site Infection Prevention

## 2017-05-14 ENCOUNTER — Inpatient Hospital Stay (HOSPITAL_COMMUNITY)
Admission: RE | Admit: 2017-05-14 | Discharge: 2017-05-14 | Disposition: A | Discharge status: Home / Self Care | Payer: Managed Care, Other (non HMO) | Source: Ambulatory Visit

## 2017-05-15 ENCOUNTER — Other Ambulatory Visit: Payer: Self-pay | Admitting: Surgery

## 2017-05-17 ENCOUNTER — Ambulatory Visit
Admission: RE | Admit: 2017-05-17 | Discharge: 2017-05-17 | Disposition: A | Discharge status: Home / Self Care | Payer: Managed Care, Other (non HMO) | Source: Ambulatory Visit | Attending: Surgery | Admitting: Surgery

## 2017-05-17 ENCOUNTER — Ambulatory Visit (HOSPITAL_COMMUNITY): Admission: RE | Admit: 2017-05-17 | Payer: Managed Care, Other (non HMO) | Source: Ambulatory Visit | Admitting: Surgery

## 2017-05-17 ENCOUNTER — Encounter (HOSPITAL_COMMUNITY): Admission: RE | Payer: Self-pay | Source: Ambulatory Visit

## 2017-05-17 DIAGNOSIS — D0512 Intraductal carcinoma in situ of left breast: Secondary | ICD-10-CM

## 2017-05-17 SURGERY — BREAST LUMPECTOMY WITH RADIOACTIVE SEED LOCALIZATION
Anesthesia: General | Laterality: Left

## 2017-05-25 ENCOUNTER — Other Ambulatory Visit: Payer: Self-pay | Admitting: *Deleted

## 2017-05-25 ENCOUNTER — Telehealth: Payer: Self-pay

## 2017-05-25 DIAGNOSIS — D0512 Intraductal carcinoma in situ of left breast: Secondary | ICD-10-CM

## 2017-05-25 NOTE — Telephone Encounter (Signed)
Spoke with patient and she is aware of her f/u appt per 8/24 inbasket message  Meagan [name]

## 2017-05-28 ENCOUNTER — Telehealth: Payer: Self-pay | Admitting: Hematology and Oncology

## 2017-05-28 NOTE — Telephone Encounter (Signed)
Spoke with patient and scheduled her nut 45 appt this Thursday.

## 2017-05-31 ENCOUNTER — Ambulatory Visit: Payer: Managed Care, Other (non HMO) | Admitting: Nutrition

## 2017-05-31 NOTE — Progress Notes (Signed)
41 year old female diagnosed with DCIS. She is a patient of Dr. Lindi Adie.  Past medical history is not significant.  Medications include multivitamin.  Labs were reviewed.  Height: 66 inches. Weight: 132 pounds on July 13. BMI: 21.39.  Patient has questions regarding diet after diagnosis of DCIS.  Nutrition diagnosis:  Food and nutrition related knowledge deficit related to DCIS and associated treatments as evidenced by no prior need for nutrition related information.  Intervention: Patient was educated to consume plant-based diet with whole gr, fruits and vegetables. Reviewed evidence-based recommendations. Fact sheets were provided. Questions were answered.  Teach back method used.  Monitoring, evaluation, goals: Patient will tolerate plant-based diet.  Nutrition diagnosis resolved.  No follow-up.  **Disclaimer: This note was dictated with voice recognition software. Similar sounding words can inadvertently be transcribed and this note may contain transcription errors which may not have been corrected upon publication of note.**

## 2017-05-31 NOTE — H&P (Signed)
Subjective:     Patient ID: Meagan [name] is a 41 y.o. female.  HPI  Here for follow up discussion prior to planned mastectomy and breast reconstruction. Presented following screening MMG with left breast asymmetries. Diagnostic MMG and [place] with two lesions left breast  12 o'clock 1 cmfn measuring 9 x 1 x 5 mm and at 12:30 2 cmfn measuring 5 x 2 x 6 mm. [place] with normal axilla. Biopsy both revealed low-grade DCIS, ER/PR+. 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 MRI witho significant enhancement in the left breast above the background enhancement. Patient has elected for mastectomy.  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  Current < A cup, notes she has to special order bras. If she pursues mastectomy, goal would be not to have to special order bras. Wt stable  Works from home in [employer]. Two kids ages 63 and 31.   Review of Systems  Objective:   Physical Exam  Cardiovascular: Normal rate, regular rhythm and normal heart sounds.   Pulmonary/Chest: Effort normal and breath sounds normal.  Abdominal:  No soft tissue redundancy  Lymphadenopathy:    She has no axillary adenopathy.   no masses no animation No ptosis, <2 cm soft tissue pinch over muscle bilateral SN to nipple R 17 L 17 cm BW R 11 L 11 cm Nipple to IMF R 7 L 7 cm    Assessment:     DCIS Left breast    Plan:     Plan NSM with TE immediate placement.  We have had multiple email discussions regarding prepectoral vs dual plane reconstruction. She has watched online video demonstrating animation- discussed this would be biggest benefit of prepectoral placement. However risk is more visible rippling over upper poles or social breast. Also discussed that given her small amount overlying breast tissue I will elutimately recommend dual plane augmentation on opposite breast for symmetry. She is at risk animation on this side. If left is above muscle and right dual plane she may have asymmetry  Especially over upper poles. Discussed use of ADM  in reconstruction- differenced between prepec and dual place with this. Both instances reviewed cadaveric source, risk seroma and in short term if problems with infection in another foreign body like the TE and may need to be removed.  Plan dual plan position. Reviewed drains, overnight stay, post procedure limitations.   Irene Limbo, MD [facility] [phone], pin [phone]

## 2017-06-01 ENCOUNTER — Encounter (HOSPITAL_BASED_OUTPATIENT_CLINIC_OR_DEPARTMENT_OTHER): Payer: Self-pay | Admitting: *Deleted

## 2017-06-11 NOTE — H&P (Signed)
Meagan [name]  Location: [facility] Patient #: [id number] DOB: [birth year] Married / Language: English / Race: White Female   History of Present Illness   The patient is a 41 year old female who presents with breast cancer. This is a pleasant patient referred by Dr. 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 She had abnormalities seen on screening mammography. She only has a remote family history of breast cancer. She has had no nipple discharge. She has had no previous history of surgery on her breast. She had 2 separate areas at the 12 and 12:30 position of the left breast which were biopsied and both showed DCIS. She has now had a third area biopsied which was benign. She has already seen medical and radiation oncology. We will also be discussing her tomorrow in the multidisciplinary breast conference. She is currently without complaints. She did well with the biopsies. She is otherwise very healthy.   Past Surgical History  Appendectomy  Breast Biopsy  Left. Oral Surgery   Diagnostic Studies History  Colonoscopy  never Mammogram  within last year Pap Smear  1-5 years ago  Allergies  Erythromycin *MACROLIDES*  Allergies Reconciled   Medication History No Current Medications Medications Reconciled  Social History  Alcohol use  Moderate alcohol use. Caffeine use  Coffee. No drug use  Tobacco use  Never smoker.  Family History  Cervical Cancer  Mother. Depression  Sister. Hypertension  Father. Melanoma  Father. Thyroid problems  Sister.  Pregnancy / Birth History   Age at menarche  42 years. Contraceptive History  Oral contraceptives. Gravida  3 Length (months) of breastfeeding  12-24 Maternal age  67-35 Para  2 Regular periods   Other Problems  Asthma  Breast Cancer     Review of Systems General Not Present- Appetite Loss, Chills, Fatigue, Fever, Night Sweats,  Weight Gain and Weight Loss. Skin Not Present- Change in Wart/Mole, Dryness, Hives, Jaundice, New Lesions, Non-Healing Wounds, Rash and Ulcer. HEENT Present- Wears glasses/contact lenses. Not Present- Earache, Hearing Loss, Hoarseness, Nose Bleed, Oral Ulcers, Ringing in the Ears, Seasonal Allergies, Sinus Pain, Sore Throat, Visual Disturbances and Yellow Eyes. Respiratory Not Present- Bloody sputum, Chronic Cough, Difficulty Breathing, Snoring and Wheezing. Breast Present- Breast Mass. Not Present- Breast Pain, Nipple Discharge and Skin Changes. Cardiovascular Not Present- Chest Pain, Difficulty Breathing Lying Down, Leg Cramps, Palpitations, Rapid Heart Rate, Shortness of Breath and Swelling of Extremities. Gastrointestinal Not Present- Abdominal Pain, Bloating, Bloody Stool, Change in Bowel Habits, Chronic diarrhea, Constipation, Difficulty Swallowing, Excessive gas, Gets full quickly at meals, Hemorrhoids, Indigestion, Nausea, Rectal Pain and Vomiting. Female Genitourinary Not Present- Frequency, Nocturia, Painful Urination, Pelvic Pain and Urgency. Musculoskeletal Not Present- Back Pain, Joint Pain, Joint Stiffness, Muscle Pain, Muscle Weakness and Swelling of Extremities. Neurological Not Present- Decreased Memory, Fainting, Headaches, Numbness, Seizures, Tingling, Tremor, Trouble walking and Weakness. Psychiatric Present- Anxiety, Fearful and Frequent crying. Not Present- Bipolar, Change in Sleep Pattern and Depression. Endocrine Not Present- Cold Intolerance, Excessive Hunger, Hair Changes, Heat Intolerance, Hot flashes and New Diabetes. Hematology Not Present- Blood Thinners, Easy Bruising, Excessive bleeding, Gland problems, HIV and Persistent Infections.  Vitals   Weight: 132 lb Height: 66in Body Surface Area: 1.68 m Body Mass Index: 21.31 kg/m  Temp.: 97.75F  Pulse: 73 (Regular)  P.OX: 98% (Room air) BP: 102/64 (Sitting, Left Arm, Standard)    Physical Exam    General Mental Status-Alert. General Appearance-Consistent with stated age. Hydration-Well  hydrated. Voice-Normal.  Head and Neck Head-normocephalic, atraumatic with no lesions or palpable masses. Trachea-midline. Thyroid Gland Characteristics - normal size and consistency.  Eye Eyeball - Bilateral-Extraocular movements intact. Sclera/Conjunctiva - Bilateral-No scleral icterus.  Chest and Lung Exam Chest and lung exam reveals -quiet, even and easy respiratory effort with no use of accessory muscles and on auscultation, normal breath sounds, no adventitious sounds and normal vocal resonance. Inspection Chest Wall - Normal. Back - normal.  Breast Breast - Left-Symmetric and Biopsy scar, Non Tender, No Dimpling, No Inflammation, No Lumpectomy scars, No Mastectomy scars, No Peau d' Orange. Note: There are small scars from her biopsy. There are no palpable masses in the left breast Breast - Right-Symmetric, Non Tender, No Biopsy scars, no Dimpling, No Inflammation, No Lumpectomy scars, No Mastectomy scars, No Peau d' Orange. Breast Lump-No Palpable Breast Mass.  Cardiovascular Cardiovascular examination reveals -normal heart sounds, regular rate and rhythm with no murmurs and normal pedal pulses bilaterally.  Abdomen Inspection Inspection of the abdomen reveals - No Hernias. Skin - Scar - no surgical scars. Palpation/Percussion Palpation and Percussion of the abdomen reveal - Soft, Non Tender, No Rebound tenderness, No Rigidity (guarding) and No hepatosplenomegaly. Auscultation Auscultation of the abdomen reveals - Bowel sounds normal.  Neurologic Neurologic evaluation reveals -alert and oriented x 3 with no impairment of recent or remote memory. Mental Status-Normal.  Musculoskeletal Normal Exam - Left-Upper Extremity Strength Normal and Lower Extremity Strength Normal. Normal Exam - Right-Upper Extremity Strength Normal and Lower Extremity  Strength Normal.  Lymphatic Head & Neck  General Head & Neck Lymphatics: Bilateral - Description - Normal. Axillary  General Axillary Region: Bilateral - Description - Normal. Tenderness - Non Tender. Femoral & Inguinal  Generalized Femoral & Inguinal Lymphatics: Bilateral - Description - Normal. Tenderness - Non Tender.    Assessment & Plan    DUCTAL CARCINOMA IN SITU (DCIS) OF LEFT BREAST (D05.12)  Impression: This is a patient with DCIS in the left breast. This is either one large area or 2 smaller areas that are adjacent to each other. She has very small breasts and I believe the issue will be whether we can do breast conservation given her size. She may need an MRI depending on what the radiologist pain. We may also have to refer her to genetic counseling. I discussed breast conservation as well as mastectomy with immediate reconstruction with her. I discussed the surgical procedures in detail including the risks. I will call her back tomorrow with further plans after we have discussed her in the conference. She understands and agrees with the plan.  Addendum:  Mrs. Davanzo went on to have an MRI as well as a biopsy of another area which was benign.  She also got a second opinion at [facility]. After multiple discussions, she wishes to proceed with a nipple sparing left breast mastectomy.  We discussed the risks and the benefits of also doing a sentinel node biopsy.  We extensive discussed the positive and negatives of this.  She wants to hold on a node biopsy which I feel is totally reasonable. She has seen plastic surgery.  Surgery is scheduled.

## 2017-06-11 NOTE — Progress Notes (Signed)
Ensure pre-surgery drink given with instructions to complete by [place], hibiclens soap given with instructions, pt verbalized understanding.

## 2017-06-12 ENCOUNTER — Encounter (HOSPITAL_BASED_OUTPATIENT_CLINIC_OR_DEPARTMENT_OTHER)
Admission: RE | Disposition: A | Discharge status: Home / Self Care | Payer: Self-pay | Source: Ambulatory Visit | Attending: Surgery

## 2017-06-12 ENCOUNTER — Ambulatory Visit (HOSPITAL_BASED_OUTPATIENT_CLINIC_OR_DEPARTMENT_OTHER): Payer: Managed Care, Other (non HMO) | Admitting: Certified Registered Nurse Anesthetist

## 2017-06-12 ENCOUNTER — Encounter (HOSPITAL_BASED_OUTPATIENT_CLINIC_OR_DEPARTMENT_OTHER): Payer: Self-pay | Admitting: Anesthesiology

## 2017-06-12 ENCOUNTER — Ambulatory Visit (HOSPITAL_BASED_OUTPATIENT_CLINIC_OR_DEPARTMENT_OTHER)
Admission: RE | Admit: 2017-06-12 | Discharge: 2017-06-13 | Disposition: A | Discharge status: Home / Self Care | Payer: Managed Care, Other (non HMO) | Source: Ambulatory Visit | Attending: Surgery | Admitting: Surgery

## 2017-06-12 DIAGNOSIS — D0512 Intraductal carcinoma in situ of left breast: Secondary | ICD-10-CM | POA: Diagnosis not present

## 2017-06-12 DIAGNOSIS — Z803 Family history of malignant neoplasm of breast: Secondary | ICD-10-CM | POA: Insufficient documentation

## 2017-06-12 DIAGNOSIS — C50912 Malignant neoplasm of unspecified site of left female breast: Secondary | ICD-10-CM | POA: Diagnosis present

## 2017-06-12 HISTORY — DX: Unspecified asthma, uncomplicated: J45.909

## 2017-06-12 HISTORY — DX: Malignant (primary) neoplasm, unspecified: C80.1

## 2017-06-12 HISTORY — PX: BREAST RECONSTRUCTION WITH PLACEMENT OF TISSUE EXPANDER AND FLEX HD (ACELLULAR HYDRATED DERMIS): SHX6295

## 2017-06-12 HISTORY — PX: NIPPLE SPARING MASTECTOMY: SHX6537

## 2017-06-12 LAB — POCT PREGNANCY, URINE: PREG TEST UR: NEGATIVE

## 2017-06-12 SURGERY — MASTECTOMY, NIPPLE SPARING
Anesthesia: General | Site: Breast | Laterality: Left

## 2017-06-12 MED ORDER — LACTATED RINGERS IV SOLN
INTRAVENOUS | Status: DC
  Administered 2017-06-12 (×3): via INTRAVENOUS

## 2017-06-12 MED ORDER — MIDAZOLAM HCL 2 MG/2ML IJ SOLN
INTRAMUSCULAR | Status: AC
  Filled 2017-06-12: qty 2

## 2017-06-12 MED ORDER — ONDANSETRON HCL 4 MG/2ML IJ SOLN: 4.0000 mg | Freq: Four times a day (QID) | INTRAMUSCULAR | Status: DC | PRN

## 2017-06-12 MED ORDER — CHLORHEXIDINE GLUCONATE CLOTH 2 % EX PADS: 6.0000 | MEDICATED_PAD | Freq: Once | CUTANEOUS | Status: DC

## 2017-06-12 MED ORDER — OXYCODONE HCL 5 MG PO TABS
5.0000 mg | ORAL_TABLET | ORAL | Status: DC | PRN
  Administered 2017-06-12 (×2): 5 mg via ORAL
  Administered 2017-06-13 (×2): 10 mg via ORAL
  Filled 2017-06-12: qty 1
  Filled 2017-06-12 (×2): qty 2

## 2017-06-12 MED ORDER — ROCURONIUM BROMIDE 100 MG/10ML IV SOLN
INTRAVENOUS | Status: DC | PRN
  Administered 2017-06-12: 40 mg via INTRAVENOUS

## 2017-06-12 MED ORDER — GABAPENTIN 300 MG PO CAPS
ORAL_CAPSULE | ORAL | Status: AC
  Filled 2017-06-12: qty 1

## 2017-06-12 MED ORDER — FENTANYL CITRATE (PF) 100 MCG/2ML IJ SOLN
INTRAMUSCULAR | Status: AC
  Filled 2017-06-12: qty 2

## 2017-06-12 MED ORDER — CELECOXIB 200 MG PO CAPS
ORAL_CAPSULE | ORAL | Status: AC
  Filled 2017-06-12: qty 2

## 2017-06-12 MED ORDER — KETOROLAC TROMETHAMINE 30 MG/ML IJ SOLN
30.0000 mg | Freq: Three times a day (TID) | INTRAMUSCULAR | Status: AC
  Administered 2017-06-12 – 2017-06-13 (×3): 30 mg via INTRAVENOUS
  Filled 2017-06-12 (×4): qty 1

## 2017-06-12 MED ORDER — CEFAZOLIN SODIUM-DEXTROSE 2-4 GM/100ML-% IV SOLN
INTRAVENOUS | Status: AC
  Filled 2017-06-12: qty 100

## 2017-06-12 MED ORDER — OXYCODONE HCL 5 MG PO TABS
5.0000 mg | ORAL_TABLET | Freq: Once | ORAL | Status: DC | PRN
  Filled 2017-06-12: qty 1

## 2017-06-12 MED ORDER — DEXAMETHASONE SODIUM PHOSPHATE 10 MG/ML IJ SOLN
INTRAMUSCULAR | Status: AC
  Filled 2017-06-12: qty 1

## 2017-06-12 MED ORDER — MIDAZOLAM HCL 2 MG/2ML IJ SOLN
1.0000 mg | INTRAMUSCULAR | Status: DC | PRN
  Administered 2017-06-12: 2 mg via INTRAVENOUS
  Administered 2017-06-12 (×2): 1 mg via INTRAVENOUS

## 2017-06-12 MED ORDER — CEFAZOLIN SODIUM-DEXTROSE 2-4 GM/100ML-% IV SOLN
2.0000 g | INTRAVENOUS | Status: AC
  Administered 2017-06-12: 2 g via INTRAVENOUS

## 2017-06-12 MED ORDER — CELECOXIB 400 MG PO CAPS
400.0000 mg | ORAL_CAPSULE | Freq: Once | ORAL | Status: AC
  Administered 2017-06-12: 400 mg via ORAL

## 2017-06-12 MED ORDER — METHOCARBAMOL 500 MG PO TABS: 500.0000 mg | ORAL_TABLET | Freq: Three times a day (TID) | ORAL | 0 refills | Status: DC | PRN

## 2017-06-12 MED ORDER — HYDROMORPHONE HCL 1 MG/ML IJ SOLN: 0.2500 mg | INTRAMUSCULAR | Status: DC | PRN

## 2017-06-12 MED ORDER — PROPOFOL 10 MG/ML IV BOLUS
INTRAVENOUS | Status: AC
  Filled 2017-06-12: qty 20

## 2017-06-12 MED ORDER — LIDOCAINE HCL (CARDIAC) 20 MG/ML IV SOLN
INTRAVENOUS | Status: DC | PRN
  Administered 2017-06-12: 100 mg via INTRAVENOUS

## 2017-06-12 MED ORDER — FENTANYL CITRATE (PF) 100 MCG/2ML IJ SOLN
50.0000 ug | INTRAMUSCULAR | Status: DC | PRN
  Administered 2017-06-12: 100 ug via INTRAVENOUS

## 2017-06-12 MED ORDER — SODIUM CHLORIDE 0.9 % IV SOLN
INTRAVENOUS | Status: DC | PRN
  Administered 2017-06-12: 1000 mL

## 2017-06-12 MED ORDER — SCOPOLAMINE 1 MG/3DAYS TD PT72
1.0000 | MEDICATED_PATCH | Freq: Once | TRANSDERMAL | Status: DC | PRN
  Administered 2017-06-12: 1.5 mg via TRANSDERMAL

## 2017-06-12 MED ORDER — PROCHLORPERAZINE EDISYLATE 5 MG/ML IJ SOLN: 5.0000 mg | Freq: Four times a day (QID) | INTRAMUSCULAR | Status: DC | PRN

## 2017-06-12 MED ORDER — OXYCODONE HCL 5 MG PO TABS: 5.0000 mg | ORAL_TABLET | ORAL | 0 refills | Status: DC | PRN

## 2017-06-12 MED ORDER — PROMETHAZINE HCL 25 MG/ML IJ SOLN: 6.2500 mg | INTRAMUSCULAR | Status: DC | PRN

## 2017-06-12 MED ORDER — HYDROMORPHONE HCL 1 MG/ML IJ SOLN: 0.5000 mg | INTRAMUSCULAR | Status: DC | PRN

## 2017-06-12 MED ORDER — SUGAMMADEX SODIUM 200 MG/2ML IV SOLN
INTRAVENOUS | Status: DC | PRN
  Administered 2017-06-12: 200 mg via INTRAVENOUS

## 2017-06-12 MED ORDER — KCL IN DEXTROSE-NACL 20-5-0.45 MEQ/L-%-% IV SOLN
INTRAVENOUS | Status: DC
  Administered 2017-06-12: 14:00:00 via INTRAVENOUS
  Filled 2017-06-12: qty 1000

## 2017-06-12 MED ORDER — HYDROMORPHONE HCL 1 MG/ML IJ SOLN
INTRAMUSCULAR | Status: AC
  Filled 2017-06-12: qty 0.5

## 2017-06-12 MED ORDER — FENTANYL CITRATE (PF) 100 MCG/2ML IJ SOLN
INTRAMUSCULAR | Status: DC | PRN
Start: 2017-06-12 — End: 2017-06-12
  Administered 2017-06-12 (×2): 50 ug via INTRAVENOUS

## 2017-06-12 MED ORDER — ROPIVACAINE HCL 7.5 MG/ML IJ SOLN
INTRAMUSCULAR | Status: DC | PRN
  Administered 2017-06-12: 20 mL via PERINEURAL

## 2017-06-12 MED ORDER — METHOCARBAMOL 500 MG PO TABS
500.0000 mg | ORAL_TABLET | Freq: Three times a day (TID) | ORAL | Status: DC | PRN
  Administered 2017-06-12 – 2017-06-13 (×2): 500 mg via ORAL
  Filled 2017-06-12 (×2): qty 1

## 2017-06-12 MED ORDER — CEFAZOLIN SODIUM-DEXTROSE 1-4 GM/50ML-% IV SOLN
1.0000 g | Freq: Three times a day (TID) | INTRAVENOUS | Status: DC
  Administered 2017-06-12 – 2017-06-13 (×2): 1 g via INTRAVENOUS
  Filled 2017-06-12 (×2): qty 50

## 2017-06-12 MED ORDER — MEPERIDINE HCL 25 MG/ML IJ SOLN: 6.2500 mg | INTRAMUSCULAR | Status: DC | PRN

## 2017-06-12 MED ORDER — ONDANSETRON 4 MG PO TBDP: 4.0000 mg | ORAL_TABLET | Freq: Four times a day (QID) | ORAL | Status: DC | PRN

## 2017-06-12 MED ORDER — GABAPENTIN 300 MG PO CAPS
300.0000 mg | ORAL_CAPSULE | ORAL | Status: AC
  Administered 2017-06-12: 300 mg via ORAL

## 2017-06-12 MED ORDER — ONDANSETRON HCL 4 MG/2ML IJ SOLN
INTRAMUSCULAR | Status: AC
  Filled 2017-06-12: qty 2

## 2017-06-12 MED ORDER — OXYCODONE HCL 5 MG/5ML PO SOLN: 5.0000 mg | Freq: Once | ORAL | Status: DC | PRN

## 2017-06-12 MED ORDER — ACETAMINOPHEN 500 MG PO TABS
ORAL_TABLET | ORAL | Status: AC
  Filled 2017-06-12: qty 2

## 2017-06-12 MED ORDER — SCOPOLAMINE 1 MG/3DAYS TD PT72
MEDICATED_PATCH | TRANSDERMAL | Status: AC
  Filled 2017-06-12: qty 1

## 2017-06-12 MED ORDER — SULFAMETHOXAZOLE-TRIMETHOPRIM 800-160 MG PO TABS: 1.0000 | ORAL_TABLET | Freq: Two times a day (BID) | ORAL | 0 refills | Status: DC

## 2017-06-12 MED ORDER — PROPOFOL 10 MG/ML IV BOLUS
INTRAVENOUS | Status: DC | PRN
  Administered 2017-06-12: 160 mg via INTRAVENOUS

## 2017-06-12 MED ORDER — PROCHLORPERAZINE MALEATE 10 MG PO TABS: 10.0000 mg | ORAL_TABLET | Freq: Four times a day (QID) | ORAL | Status: DC | PRN

## 2017-06-12 MED ORDER — ACETAMINOPHEN 500 MG PO TABS
1000.0000 mg | ORAL_TABLET | ORAL | Status: AC
  Administered 2017-06-12: 1000 mg via ORAL

## 2017-06-12 MED ORDER — DEXAMETHASONE SODIUM PHOSPHATE 4 MG/ML IJ SOLN
INTRAMUSCULAR | Status: DC | PRN
  Administered 2017-06-12: 10 mg via INTRAVENOUS

## 2017-06-12 MED ORDER — LIDOCAINE 2% (20 MG/ML) 5 ML SYRINGE
INTRAMUSCULAR | Status: AC
  Filled 2017-06-12: qty 5

## 2017-06-12 MED ORDER — ROCURONIUM BROMIDE 10 MG/ML (PF) SYRINGE
PREFILLED_SYRINGE | INTRAVENOUS | Status: AC
  Filled 2017-06-12: qty 5

## 2017-06-12 MED ORDER — ONDANSETRON HCL 4 MG/2ML IJ SOLN
INTRAMUSCULAR | Status: DC | PRN
  Administered 2017-06-12: 4 mg via INTRAVENOUS

## 2017-06-12 SURGICAL SUPPLY — 69 items
ALLODERM 4X16 MED THICK (Tissue) ×2 IMPLANT
BAG DECANTER FOR FLEXI CONT (MISCELLANEOUS) ×2 IMPLANT
BINDER BREAST LRG (GAUZE/BANDAGES/DRESSINGS) IMPLANT
BINDER BREAST MEDIUM (GAUZE/BANDAGES/DRESSINGS) ×2 IMPLANT
BLADE HEX COATED 2.75 (ELECTRODE) ×2 IMPLANT
BLADE SURG 10 STRL SS (BLADE) ×2 IMPLANT
BLADE SURG 15 STRL LF DISP TIS (BLADE) ×2 IMPLANT
BLADE SURG 15 STRL SS (BLADE) ×2
BNDG GAUZE ELAST 4 BULKY (GAUZE/BANDAGES/DRESSINGS) ×4 IMPLANT
CANISTER SUCT 1200ML W/VALVE (MISCELLANEOUS) ×4 IMPLANT
CHLORAPREP W/TINT 26ML (MISCELLANEOUS) ×2 IMPLANT
COVER BACK TABLE 60X90IN (DRAPES) ×2 IMPLANT
COVER MAYO STAND STRL (DRAPES) ×2 IMPLANT
DERMABOND ADVANCED (GAUZE/BANDAGES/DRESSINGS) ×1
DERMABOND ADVANCED .7 DNX12 (GAUZE/BANDAGES/DRESSINGS) ×1 IMPLANT
DRAIN CHANNEL 15F RND FF W/TCR (WOUND CARE) ×2 IMPLANT
DRAPE TOP ARMCOVERS (MISCELLANEOUS) ×2 IMPLANT
DRAPE U-SHAPE 76X120 STRL (DRAPES) ×2 IMPLANT
DRAPE UTILITY XL STRL (DRAPES) ×2 IMPLANT
DRSG PAD ABDOMINAL 8X10 ST (GAUZE/BANDAGES/DRESSINGS) ×4 IMPLANT
DRSG TEGADERM 4X10 (GAUZE/BANDAGES/DRESSINGS) ×2 IMPLANT
DRSG TEGADERM 4X4.75 (GAUZE/BANDAGES/DRESSINGS) IMPLANT
ELECT BLADE 4.0 EZ CLEAN MEGAD (MISCELLANEOUS) ×2
ELECT COATED BLADE 2.86 ST (ELECTRODE) ×2 IMPLANT
ELECT REM PT RETURN 9FT ADLT (ELECTROSURGICAL) ×2
ELECTRODE BLDE 4.0 EZ CLN MEGD (MISCELLANEOUS) ×1 IMPLANT
ELECTRODE REM PT RTRN 9FT ADLT (ELECTROSURGICAL) ×1 IMPLANT
EVACUATOR SILICONE 100CC (DRAIN) ×2 IMPLANT
EXPANDER BREAST MV 250CC (Breast) ×2 IMPLANT
GLOVE BIO SURGEON STRL SZ 6 (GLOVE) ×4 IMPLANT
GLOVE BIO SURGEON STRL SZ7 (GLOVE) ×2 IMPLANT
GLOVE BIOGEL PI IND STRL 7.5 (GLOVE) ×2 IMPLANT
GLOVE BIOGEL PI INDICATOR 7.5 (GLOVE) ×2
GLOVE SURG SIGNA 7.5 PF LTX (GLOVE) ×2 IMPLANT
GOWN STRL REUS W/ TWL LRG LVL3 (GOWN DISPOSABLE) ×2 IMPLANT
GOWN STRL REUS W/ TWL XL LVL3 (GOWN DISPOSABLE) ×1 IMPLANT
GOWN STRL REUS W/TWL LRG LVL3 (GOWN DISPOSABLE) ×2
GOWN STRL REUS W/TWL XL LVL3 (GOWN DISPOSABLE) ×1
IV NS 500ML (IV SOLUTION) ×1
IV NS 500ML BAXH (IV SOLUTION) ×1 IMPLANT
KIT FILL SYSTEM UNIVERSAL (SET/KITS/TRAYS/PACK) ×2 IMPLANT
NS IRRIG 1000ML POUR BTL (IV SOLUTION) ×2 IMPLANT
PACK BASIN DAY SURGERY FS (CUSTOM PROCEDURE TRAY) ×2 IMPLANT
PENCIL BUTTON HOLSTER BLD 10FT (ELECTRODE) ×2 IMPLANT
PIN SAFETY STERILE (MISCELLANEOUS) ×2 IMPLANT
SHEET MEDIUM DRAPE 40X70 STRL (DRAPES) ×2 IMPLANT
SLEEVE SCD COMPRESS KNEE MED (MISCELLANEOUS) ×2 IMPLANT
SPONGE LAP 18X18 X RAY DECT (DISPOSABLE) ×4 IMPLANT
STAPLER VISISTAT 35W (STAPLE) ×2 IMPLANT
SUT CHROMIC 4 0 PS 2 18 (SUTURE) IMPLANT
SUT ETHILON 2 0 FS 18 (SUTURE) ×2 IMPLANT
SUT ETHILON 3 0 PS 1 (SUTURE) ×2 IMPLANT
SUT MNCRL AB 4-0 PS2 18 (SUTURE) ×2 IMPLANT
SUT PDS AB 2-0 CT2 27 (SUTURE) IMPLANT
SUT SILK 2 0 SH (SUTURE) ×2 IMPLANT
SUT VIC AB 0 CT1 27 (SUTURE)
SUT VIC AB 0 CT1 27XBRD ANBCTR (SUTURE) IMPLANT
SUT VIC AB 3-0 SH 27 (SUTURE) ×3
SUT VIC AB 3-0 SH 27X BRD (SUTURE) ×3 IMPLANT
SUT VICRYL 0 CT-2 (SUTURE) ×4 IMPLANT
SUT VICRYL 4-0 PS2 18IN ABS (SUTURE) ×2 IMPLANT
SUT VLOC 180 0 24IN GS25 (SUTURE) IMPLANT
SYR BULB IRRIGATION 50ML (SYRINGE) ×2 IMPLANT
TISSUE ALLDRM 4X16 MED THICK (Tissue) ×1 IMPLANT
TOWEL OR 17X24 6PK STRL BLUE (TOWEL DISPOSABLE) ×4 IMPLANT
TOWEL OR NON WOVEN STRL DISP B (DISPOSABLE) ×2 IMPLANT
TUBE CONNECTING 20X1/4 (TUBING) ×4 IMPLANT
UNDERPAD 30X30 (UNDERPADS AND DIAPERS) ×2 IMPLANT
YANKAUER SUCT BULB TIP NO VENT (SUCTIONS) ×2 IMPLANT

## 2017-06-12 NOTE — Anesthesia Preprocedure Evaluation (Signed)
Anesthesia Evaluation  Patient identified by MRN, date of birth, ID band Patient awake    Reviewed: Allergy & Precautions, NPO status , Patient's Chart, lab work & pertinent test results  Airway Mallampati: II  TM Distance: >3 FB Neck ROM: Full    Dental no notable dental hx.    Pulmonary neg pulmonary ROS,    Pulmonary exam normal breath sounds clear to auscultation       Cardiovascular negative cardio ROS Normal cardiovascular exam Rhythm:Regular Rate:Normal     Neuro/Psych negative neurological ROS  negative psych ROS   GI/Hepatic negative GI ROS, Neg liver ROS,   Endo/Other  negative endocrine ROS  Renal/GU negative Renal ROS     Musculoskeletal negative musculoskeletal ROS (+)   Abdominal   Peds  Hematology negative hematology ROS (+)   Anesthesia Other Findings   Reproductive/Obstetrics negative OB ROS                            Anesthesia Physical Anesthesia Plan  ASA: II  Anesthesia Plan: General   Post-op Pain Management: GA combined w/ Regional for post-op pain   Induction: Intravenous  PONV Risk Score and Plan: 4 or greater and Ondansetron, Dexamethasone, Midazolam and Scopolamine patch - Pre-op  Airway Management Planned: Oral ETT  Additional Equipment:   Intra-op Plan:   Post-operative Plan: Extubation in OR  Informed Consent: I have reviewed the patients History and Physical, chart, labs and discussed the procedure including the risks, benefits and alternatives for the proposed anesthesia with the patient or authorized representative who has indicated his/her understanding and acceptance.   Dental advisory given  Plan Discussed with: CRNA  Anesthesia Plan Comments:         Anesthesia Quick Evaluation

## 2017-06-12 NOTE — Anesthesia Postprocedure Evaluation (Signed)
Anesthesia Post Note  Patient: Meagan [name]  Procedure(s) Performed: Procedure(s) (LRB): LEFT BREAST NIPPLE SPARING MASTECTOMY (Left) LEFT BREAST RECONSTRUCTION WITH PLACEMENT OF TISSUE EXPANDERAND ALLODERM (Left)     Patient location during evaluation: PACU Anesthesia Type: General and Regional Level of consciousness: sedated and patient cooperative Pain management: pain level controlled Vital Signs Assessment: post-procedure vital signs reviewed and stable Respiratory status: spontaneous breathing Cardiovascular status: stable Anesthetic complications: no    Last Vitals:  Vitals:   06/12/17 1349 06/12/17 1402  BP:  109/69  Pulse: (!) 54 (!) 50  Resp: 13 14  Temp:  (!) 36.3 C  SpO2: 100% 100%    Last Pain:  Vitals:   06/12/17 1445  TempSrc:   PainSc: [place]

## 2017-06-12 NOTE — Anesthesia Procedure Notes (Addendum)
Anesthesia Regional Block: Pectoralis block   Pre-Anesthetic Checklist: ,, timeout performed, Correct Patient, Correct Site, Correct Laterality, Correct Procedure, Correct Position, site marked, Risks and benefits discussed,  Surgical consent,  Pre-op evaluation,  At surgeon's request and post-op pain management  Laterality: Left  Prep: chloraprep       Needles:   Needle Type: Stimiplex     Needle Length: 9cm      Additional Needles:   Procedures: ultrasound guided,,,,,,,,  Narrative:  Start time: 06/12/2017 9:54 AM End time: 06/12/2017 9:59 AM Injection made incrementally with aspirations every 5 mL.  Performed by: Personally  Anesthesiologist: Nolon Nations  Additional Notes: Patient tolerated well. Good fascial spread noted.

## 2017-06-12 NOTE — Interval H&P Note (Signed)
History and Physical Interval Note: no change in H and P  06/12/2017 9:04 AM  Meagan [name]  has presented today for surgery, with the diagnosis of LEFT BREAST DCIS  The various methods of treatment have been discussed with the patient and family. After consideration of risks, benefits and other options for treatment, the patient has consented to  Procedure(s): LEFT BREAST NIPPLE SPARING MASTECTOMY (Left) LEFT BREAST RECONSTRUCTION WITH PLACEMENT OF TISSUE EXPANDERAND ALLODERM (Left) as a surgical intervention .  The patient's history has been reviewed, patient examined, no change in status, stable for surgery.  I have reviewed the patient's chart and labs.  Questions were answered to the patient's satisfaction.     [name]

## 2017-06-12 NOTE — Op Note (Signed)
Operative Note   DATE OF OPERATION: 9.11.18  LOCATION: [facility]-observation  SURGICAL DIVISION: Plastic Surgery  PREOPERATIVE DIAGNOSES:  1. Left breast DCIS  POSTOPERATIVE DIAGNOSES:  same  PROCEDURE:  1. Left breast reconstruction with tissue expander 2. Acellular dermis (Alloderm) for breast reconstruction 55 cm2  SURGEON: Irene Limbo MD MBA  ASSISTANT: none  ANESTHESIA:  General.   EBL: 50 ml for entire procedure  COMPLICATIONS: None immediate.   INDICATIONS FOR PROCEDURE:  The patient, Meagan [name], is a 41 y.o. female born on 19-Mar-1976, is here for immediate expander based reconstruction following left nipple sparing mastectomy.   FINDINGS: Natrelle 133MV-11 T 250 ml tissue expander, initial fill volume 60 ml. SN [id number]  DESCRIPTION OF PROCEDURE:  The patient was marked with the patient in the preoperative area to mark sternal notch, chest midline, anterior axillary lines and inframammary folds. The patient was taken to the operating room. SCDs were placed and IV antibiotics were given. The patient's operative site was prepped and draped in a sterile fashion. A time out was performed and all information was confirmed to be correct. Following completion of mastectomy, reconstruction began on left side.  Lower border pectoralis muscle identified and divided, taking care to preserve sternal and medial insertions. Submuscular dissection then completed to accommodate the expander. Acellular dermis prepared and perforated. This was inset to chest wall with running 3-0 vicryl. The cavity was irrigated with Ancef, gentamicin, bacitracin solution and hemostasis ensured. 15 Fr JP placed in cavity and secured with 2-0 nylon. The cavity was then irrigated with Betadine. Expander prepared and placed in submuscular cavity. This was secured to chest wall with 3-0 vicryl. Acellular dermis was then wrapped over lower border expander and secured to lateral pectoralis muscle  with 3-0 vicryl. Laterally the mastectomy flap over posterior axillary line was advanced anteriorly and the subcutaneous tissue and superficial fascia was secured to pectoralis muscle and acellular dermis with 0-vicryl. Skin closure completedwith 3-0 vicryl in fascial layer and 4-0 vicryl in dermis. Skin closure completed with 4-0 monocryl subcuticular and tissue adhesive. The port was accessed and filled to 60 ml of saline. Tissue adhesive applied. The patient was brought to upright sitting position and nipple areolar complex was positioned symmetric from sternal notch and chest midline with opposite breast. Tegaderms applied. Patient returned to supine position.Dry dressing and breast binder applied.  The patient was allowed to wake from anesthesia, extubated and taken to the recovery room in satisfactory condition.   SPECIMENS: none  DRAINS: 15 Fr JP in left breast reconstruction  Irene Limbo, MD [facility] [phone], pin [phone]

## 2017-06-12 NOTE — Progress Notes (Signed)
Assisted Dr. Germeroth with left, ultrasound guided, pectoralis block. Side rails up, monitors on throughout procedure. See vital signs in flow sheet. Tolerated Procedure well. 

## 2017-06-12 NOTE — Op Note (Signed)
LEFT BREAST NIPPLE SPARING MASTECTOMY  Procedure Note  Meagan [name] 06/12/2017   Pre-op Diagnosis: LEFT BREAST DCIS     Post-op Diagnosis: same  Procedure(s): LEFT BREAST NIPPLE SPARING MASTECTOMY LEFT BREAST RECONSTRUCTION WITH PLACEMENT OF TISSUE EXPANDERAND ALLODERM  Surgeon(s): Coralie Keens, MD Irene Limbo, MD  Anesthesia: General  Staff:  Circulator: Izora Ribas, RN Scrub Person: Maurene Capes, RN  Estimated Blood Loss: Minimal               Specimens: sent to path  Indications: This is a 41 year old female with 2 small foci of DCIS adjacent to each other at the 12:00 position left breast. After long discussion, she has decided proceed with a nipple sparing left breast mastectomy with placement of tissue expander by plastic surgery.  Procedure: The patient was brought to the operating room and identified as the correct patient. She was placed upon the operating table and general anesthesia was induced. Her breasts and chest were then prepped and draped in usual sterile fashion. We made a transverse incision on the inframammary ridge with a scalpel. We took this down into the breast tissue with electrocautery. We then started skin flap staying just underneath the skin excising the breast tissue. With the lighter retractors I continued to dissect medial lateral working toward the clavicle. I was able to easily get under the nipple areolar complex. Within moved on superiorly creating a good skin flap. I then dissected down to the chest wall. There are then able to dissect the breast tissue then moving superior to inferiorly off the chest wall completing the nipple sparing mastectomy. I then marked the specimen at the 12 position, laterally, and at the nipple with silk sutures. The mastectomy specimen was then sent to pathology for evaluation. We irrigated the wound with saline. Hemostasis appeared to be achieved. At this point Dr. Iran Planas continued her part of the  procedure.          [name] A   Date: 06/12/2017  Time: 11:42 AM

## 2017-06-12 NOTE — Transfer of Care (Signed)
Immediate Anesthesia Transfer of Care Note  Patient: Meagan [name]  Procedure(s) Performed: Procedure(s) (LRB): LEFT BREAST NIPPLE SPARING MASTECTOMY (Left) LEFT BREAST RECONSTRUCTION WITH PLACEMENT OF TISSUE EXPANDERAND ALLODERM (Left)  Patient Location: PACU  Anesthesia Type: General  Level of Consciousness: awake, sedated, patient cooperative and responds to stimulation  Airway & Oxygen Therapy: Patient Spontanous Breathing and Patient connected to FM O2  Post-op Assessment: Report given to PACU RN, Post -op Vital signs reviewed and stable and Patient moving all extremities  Post vital signs: Reviewed and stable  Complications: No apparent anesthesia complications

## 2017-06-12 NOTE — Interval H&P Note (Signed)
History and Physical Interval Note:  06/12/2017 10:07 AM  Meagan [name]  has presented today for surgery, with the diagnosis of LEFT BREAST DCIS  The various methods of treatment have been discussed with the patient and family. After consideration of risks, benefits and other options for treatment, the patient has consented to  Procedure(s): LEFT BREAST NIPPLE SPARING MASTECTOMY (Left) LEFT BREAST RECONSTRUCTION WITH PLACEMENT OF TISSUE EXPANDERAND ALLODERM (Left) as a surgical intervention .  The patient's history has been reviewed, patient examined, no change in status, stable for surgery.  I have reviewed the patient's chart and labs.  Questions were answered to the patient's satisfaction.     Vernis Cabacungan

## 2017-06-12 NOTE — Anesthesia Procedure Notes (Signed)
Procedure Name: Intubation Date/Time: 06/12/2017 10:59 AM Performed by: Justice Rocher Pre-anesthesia Checklist: Patient identified, Emergency Drugs available, Suction available and Patient being monitored Patient Re-evaluated:Patient Re-evaluated prior to induction Oxygen Delivery Method: Circle system utilized Preoxygenation: Pre-oxygenation with 100% oxygen Induction Type: IV induction Ventilation: Mask ventilation without difficulty Laryngoscope Size: Mac and 4 Grade View: Grade II Tube type: Oral Tube size: 7.0 mm Number of attempts: 1 Airway Equipment and Method: Stylet and Oral airway Placement Confirmation: ETT inserted through vocal cords under direct vision,  positive ETCO2 and breath sounds checked- equal and bilateral Secured at: 22 cm Tube secured with: Tape Dental Injury: Teeth and Oropharynx as per pre-operative assessment

## 2017-06-13 ENCOUNTER — Encounter (HOSPITAL_BASED_OUTPATIENT_CLINIC_OR_DEPARTMENT_OTHER): Payer: Self-pay | Admitting: Surgery

## 2017-06-13 DIAGNOSIS — D0512 Intraductal carcinoma in situ of left breast: Secondary | ICD-10-CM | POA: Diagnosis not present

## 2017-06-13 NOTE — Discharge Instructions (Signed)
[facility] °[phone] ° °MASTECTOMY: POST OP INSTRUCTIONS ° °Always review your discharge instruction sheet given to you by the facility where your surgery was performed. °IF YOU HAVE DISABILITY OR FAMILY LEAVE FORMS, YOU MUST BRING THEM TO THE OFFICE FOR PROCESSING.   °DO NOT GIVE THEM TO YOUR DOCTOR. °A prescription for pain medication may be given to you upon discharge.  Take your pain medication as prescribed, if needed.  If narcotic pain medicine is not needed, then you may take acetaminophen (Tylenol) or ibuprofen (Advil) as needed. °1. Take your usually prescribed medications unless otherwise directed. °2. If you need a refill on your pain medication, please contact your pharmacy.  They will contact our office to request authorization.  Prescriptions will not be filled after 5pm or on week-ends. °3. You should follow a light diet the first few days after arrival home, such as soup and crackers, etc.  Resume your normal diet the day after surgery. °4. Most patients will experience some swelling and bruising on the chest and underarm.  Ice packs will help.  Swelling and bruising can take several days to resolve.  °5. It is common to experience some constipation if taking pain medication after surgery.  Increasing fluid intake and taking a stool softener (such as Colace) will usually help or prevent this problem from occurring.  A mild laxative (Milk of Magnesia or Miralax) should be taken according to package instructions if there are no bowel movements after 48 hours. °6. Unless discharge instructions indicate otherwise, leave your bandage dry and in place until your next appointment in 3-5 days.  You may take a limited sponge bath.  No tube baths or showers until the drains are removed.  You may have steri-strips (small skin tapes) in place directly over the incision.  These strips should be left on the skin for 7-10 days.  If your surgeon used skin glue on the incision, you may  shower in 24 hours.  The glue will flake off over the next 2-3 weeks.  Any sutures or staples will be removed at the office during your follow-up visit. °7. DRAINS:  If you have drains in place, it is important to keep a list of the amount of drainage produced each day in your drains.  Before leaving the hospital, you should be instructed on drain care.  Call our office if you have any questions about your drains. °8. ACTIVITIES:  You may resume regular (light) daily activities beginning the next day--such as daily self-care, walking, climbing stairs--gradually increasing activities as tolerated.  You may have sexual intercourse when it is comfortable.  Refrain from any heavy lifting or straining until approved by your doctor. °a. You may drive when you are no longer taking prescription pain medication, you can comfortably wear a seatbelt, and you can safely maneuver your car and apply brakes. °b. RETURN TO WORK:  __________________________________________________________ °9. You should see your doctor in the office for a follow-up appointment approximately 3-5 days after your surgery.  Your doctor’s nurse will typically make your follow-up appointment when she calls you with your pathology report.  Expect your pathology report 2-3 business days after your surgery.  You may call to check if you do not hear from us after three days.   °10. OTHER INSTRUCTIONS: ______________________________________________________________________________________________ ____________________________________________________________________________________________ °WHEN TO CALL YOUR DOCTOR: °1. Fever over 101.0 °2. Nausea and/or vomiting °3. Extreme swelling or bruising °4. Continued bleeding from incision. °5. Increased pain, redness, or drainage from the incision. °  The clinic staff is available to answer your questions during regular business hours.  Please don’t hesitate to call and ask to speak to one of the nurses for clinical  concerns.  If you have a medical emergency, go to the nearest emergency room or call 911.  A surgeon from [facility] is always on call at the hospital. °[address], [address], [place], [place]  [address] ? [address] [address], [place], [place]   [address] °[phone] ? [phone] ? FAX [phone] °Web site: [email or web address] °

## 2017-06-13 NOTE — Discharge Summary (Signed)
Physician Discharge Summary  Patient ID: Meagan [name] MRN: [id number] DOB/AGE: March 31, 1976 41 y.o.  Admit date: 06/12/2017 Discharge date: 06/13/2017  Admission Diagnoses:  Discharge Diagnoses:  Active Problems:   Breast cancer, left breast [facility])   Discharged Condition: good  Hospital Course: uneventful post op recovery.  Discharged home POD#1  Consults: None  Significant Diagnostic Studies:   Treatments: surgery: left nipple sparing mastectomy with placement of explander  Discharge Exam: Blood pressure (!) 92/55, pulse (!) 57, temperature 98 F (36.7 C), resp. rate 16, height 5\' 6"  (1.676 m), weight 58.2 kg (128 lb 6 oz), last menstrual period 05/07/2017, SpO2 99 %. General appearance: alert, cooperative and no distress Resp: clear to auscultation bilaterally Cardio: regular rate and rhythm, S1, S2 normal, no murmur, click, rub or gallop Incision/Wound:binder in place, no hematoma  Disposition: Final discharge disposition not confirmed  Discharge Instructions    Call MD for:  redness, tenderness, or signs of infection (pain, swelling, bleeding, redness, odor or green/yellow discharge around incision site)    Complete by:  As directed    Call MD for:  temperature >100.5    Complete by:  As directed    Discharge instructions    Complete by:  As directed    Ok to remove dressings and shower am 9.13.18. Soap and water ok, pat Tegaderms dry. Do not remove tegaderm. No creams or ointments over incisions. Do not let drain dangle in shower, attach to lanyard or similar.Strip and record drain twice daily and bring log to clinic visit.  Breast binder or soft compression bra all other times.  Ok to raise arms above shoulders for bathing and dressing.  No house yard work or exercise until cleared by MD.   Recommend ibuprofen 400 mg with meals to aid with pain control. Recommend Miralax as needed for constipation.   Discharge patient    Complete by:  As directed    Discharge  disposition:  01-Home or Self Care   Discharge patient date:  06/13/2017   Driving Restrictions    Complete by:  As directed    No driving for 2 weeks, no driving if taking narcotics   Lifting restrictions    Complete by:  As directed    No lifting > 5 lbs until cleared by MD   Resume previous diet    Complete by:  As directed      Allergies as of 06/13/2017      Reactions   Erythromycin Nausea And Vomiting      Medication List    TAKE these medications   ibuprofen 200 MG tablet Commonly known as:  ADVIL,MOTRIN Take 400 mg by mouth 3 (three) times daily as needed for cramping.   methocarbamol 500 MG tablet Commonly known as:  ROBAXIN Take 1 tablet (500 mg total) by mouth every 8 (eight) hours as needed for muscle spasms.   multivitamin with minerals Tabs tablet Take 1 tablet by mouth daily.   oxyCODONE 5 MG immediate release tablet Commonly known as:  Oxy IR/ROXICODONE Take 1-2 tablets (5-10 mg total) by mouth every 4 (four) hours as needed for moderate pain.   sulfamethoxazole-trimethoprim 800-160 MG tablet Commonly known as:  BACTRIM DS,SEPTRA DS Take 1 tablet by mouth 2 (two) times daily.            Discharge Care Instructions        Start     Ordered   06/13/17 0000  Discharge patient    Question Answer Comment  Discharge  disposition 01-Home or Self Care   Discharge patient date 06/13/2017      06/13/17 0732   06/12/17 0000  methocarbamol (ROBAXIN) 500 MG tablet  Every 8 hours PRN     06/12/17 1454   06/12/17 0000  oxyCODONE (OXY IR/ROXICODONE) 5 MG immediate release tablet  Every 4 hours PRN     06/12/17 1454   06/12/17 0000  sulfamethoxazole-trimethoprim (BACTRIM DS,SEPTRA DS) 800-160 MG tablet  2 times daily     06/12/17 1454   06/12/17 0000  Resume previous diet     06/12/17 1454   06/12/17 0000  Driving Restrictions    Comments:  No driving for 2 weeks, no driving if taking narcotics   06/12/17 1454   06/12/17 0000  Lifting restrictions     Comments:  No lifting > 5 lbs until cleared by MD   06/12/17 1454   06/12/17 0000  Call MD for:  temperature >100.5     06/12/17 1454   06/12/17 0000  Call MD for:  redness, tenderness, or signs of infection (pain, swelling, bleeding, redness, odor or green/yellow discharge around incision site)     06/12/17 1454   06/12/17 0000  Discharge instructions    Comments:  Ok to remove dressings and shower am 9.13.18. Soap and water ok, pat Tegaderms dry. Do not remove tegaderm. No creams or ointments over incisions. Do not let drain dangle in shower, attach to lanyard or similar.Strip and record drain twice daily and bring log to clinic visit.  Breast binder or soft compression bra all other times.  Ok to raise arms above shoulders for bathing and dressing.  No house yard work or exercise until cleared by MD.   Recommend ibuprofen 400 mg with meals to aid with pain control. Recommend Miralax as needed for constipation.   06/12/17 1454     Follow-up Information    Irene Limbo, MD Follow up in 1 week(s).   Specialty:  Plastic Surgery Why:  as scheduled Contact information: [address] [place] [address] [phone]        Coralie Keens, MD. Schedule an appointment as soon as possible for a visit in 2 week(s).   Specialty:  General Surgery Contact information: [address]  [place] [address] [phone]           Signed: Harl Bowie 06/13/2017, 7:33 AM

## 2017-06-13 NOTE — Progress Notes (Signed)
Patient ID: Meagan [name], female   DOB: April 22, 1976, 41 y.o.   MRN: [id number]   Doing well No complaints Left chest without hematoma Pain controlled  Plan: Discharge home

## 2017-06-20 DIAGNOSIS — Z9012 Acquired absence of left breast and nipple: Secondary | ICD-10-CM | POA: Insufficient documentation

## 2017-06-27 ENCOUNTER — Other Ambulatory Visit: Payer: Self-pay | Admitting: Surgery

## 2017-06-28 NOTE — Assessment & Plan Note (Signed)
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 I discussed the margins. We also discussed the final staging along with previously performed ER/PR and testing.  Treatment plan: 1. adjuvant radiation therapy 3. Followed by antiestrogen therapy with tamoxifen 5 years  Return to clinic after radiation to start antiestrogen therapy

## 2017-06-29 ENCOUNTER — Encounter: Payer: Self-pay | Admitting: *Deleted

## 2017-06-29 ENCOUNTER — Telehealth: Payer: Self-pay

## 2017-06-29 ENCOUNTER — Ambulatory Visit (HOSPITAL_BASED_OUTPATIENT_CLINIC_OR_DEPARTMENT_OTHER): Payer: Managed Care, Other (non HMO) | Admitting: Hematology and Oncology

## 2017-06-29 DIAGNOSIS — D0512 Intraductal carcinoma in situ of left breast: Secondary | ICD-10-CM

## 2017-06-29 MED ORDER — TAMOXIFEN CITRATE 20 MG PO TABS: 20.0000 mg | ORAL_TABLET | Freq: Every day | ORAL | 3 refills | Status: DC

## 2017-06-29 NOTE — Progress Notes (Signed)
[place] Work  Clinical Social Work was referred by breast navigator for counseling referral/emotional concerns.  Clinical Social Worker attempted to contact patient by phone to offer support and assess for needs.  CSW left voicemail to return call.   Maryjean Morn, MSW, LCSW, OSW-C Clinical Social Worker [facility] [phone]

## 2017-06-29 NOTE — Progress Notes (Addendum)
 Patient Care Team: Dewey, Elizabeth R, MD as PCP - General (Family Medicine)  DIAGNOSIS:  Encounter Diagnosis  Name Primary?  . Ductal carcinoma in situ (DCIS) of left breast     SUMMARY OF ONCOLOGIC HISTORY:   Ductal carcinoma in situ (DCIS) of left breast   04/10/2017 Initial Diagnosis    Left breast biopsy 12:00 and 12:30 position: DCIS, grade 1; screening mammogram detected a 9 mm and a 5 mm abnormality in the left breast      04/21/2017 Genetic Testing    BRCA2 c.5937C>G VUS identified on the STAT panel.  The STAT Breast cancer panel offered by [facility] includes sequencing and rearrangement analysis for the following 9 genes:  ATM, BRCA1, BRCA2, CDH1, CHEK2, PALB2, PTEN, STK11 and TP53.   The report date is April 21, 2017.    Testing was reflexed to the larger common hereditary cancer panel and the melanoma cancer panel. This testing was NEGATIVE.  The Hereditary Gene Panel offered by [facility] includes sequencing and/or deletion duplication testing of the following 46 genes: APC, ATM, AXIN2, BARD1, BMPR1A, BRCA1, BRCA2, BRIP1, CDH1, CDKN2A (p14ARF), CDKN2A (p16INK4a), CHEK2, CTNNA1, DICER1, EPCAM (Deletion/duplication testing only), GREM1 (promoter region deletion/duplication testing only), KIT, MEN1, MLH1, MSH2, MSH3, MSH6, MUTYH, NBN, NF1, NHTL1, PALB2, PDGFRA, PMS2, POLD1, POLE, PTEN, RAD50, RAD51C, RAD51D, SDHB, SDHC, SDHD, SMAD4, SMARCA4. STK11, TP53, TSC1, TSC2, and VHL.  The following genes were evaluated for sequence changes only: SDHA and HOXB13 c.251G>A variant only.  The Melanoma panel offered by [facility] includes sequencing and/or deletion duplication testing of the following 12 genes: BAP1, BRCA1, BRCA2, BRIP1, CDK4, CDKN2A (p14ARF), CDKN2A (p16INK4a), MC1R, POT1, PTEN, RB1, TERT, and TP53.  The following gene was evaluated for sequence changes only: MITF (c.952G>A, p.GLU318Lys variant only).   The report date is April 29, 2017.        06/12/2017 Surgery    Left Mastectomy:  Low-grade DCIS 2.7 cm, margins negative, ER and a pleasant, PR and a pleasant, Tis Nx stage 0       CHIEF COMPLIANT: Follow-up after recent left mastectomy for DCIS  INTERVAL HISTORY: Trish Ginley is a 40 with above-mentioned history left breast DCIS who underwent mastectomy and is here today to discuss the pathology report. She was informed by Dr. Blackman that she will need to undergo nipple excision. She is here today to discuss the final adjuvant treatment plan. She has been undergoing breast reconstruction with Dr. Thimappa. The drains of, and she is doing much better. She however has a lots of emotions and anxiety and stress related to this diagnosis. She is extremely worried about the close posterior margin  REVIEW OF SYSTEMS:   Constitutional: Denies fevers, chills or abnormal weight loss Eyes: Denies blurriness of vision Ears, nose, mouth, throat, and face: Denies mucositis or sore throat Respiratory: Denies cough, dyspnea or wheezes Cardiovascular: Denies palpitation, chest discomfort Gastrointestinal:  Denies nausea, heartburn or change in bowel habits Skin: Denies abnormal skin rashes Lymphatics: Denies new lymphadenopathy or easy bruising Neurological:Denies numbness, tingling or new weaknesses Behavioral/Psych: Mood is stable, no new changes  Extremities: No lower extremity edema Breast:  Recent left mastectomy All other systems were reviewed with the patient and are negative.  I have reviewed the past medical history, past surgical history, social history and family history with the patient and they are unchanged from previous note.  ALLERGIES:  is allergic to erythromycin.  MEDICATIONS:  Current Outpatient Prescriptions  Medication Sig Dispense Refill  . ibuprofen (ADVIL,MOTRIN) 200   MG tablet Take 400 mg by mouth 3 (three) times daily as needed for cramping.    . methocarbamol (ROBAXIN) 500 MG tablet Take 1 tablet (500 mg total) by mouth every 8 (eight) hours as needed  for muscle spasms. 30 tablet 0  . Multiple Vitamin (MULTIVITAMIN WITH MINERALS) TABS tablet Take 1 tablet by mouth daily.    . oxyCODONE (OXY IR/ROXICODONE) 5 MG immediate release tablet Take 1-2 tablets (5-10 mg total) by mouth every 4 (four) hours as needed for moderate pain. 40 tablet 0  . sulfamethoxazole-trimethoprim (BACTRIM DS,SEPTRA DS) 800-160 MG tablet Take 1 tablet by mouth 2 (two) times daily. 12 tablet 0  . tamoxifen (NOLVADEX) 20 MG tablet Take 1 tablet (20 mg total) by mouth daily. 90 tablet 3   No current facility-administered medications for this visit.     PHYSICAL EXAMINATION: ECOG PERFORMANCE STATUS: 1 - Symptomatic but completely ambulatory  Vitals:   06/29/17 1059  BP: 129/74  Pulse: (!) 53  Resp: 18  Temp: 98.1 F (36.7 C)  SpO2: 100%   Filed Weights   06/29/17 1059  Weight: 128 lb 14.4 oz (58.5 kg)    GENERAL:alert, no distress and comfortable SKIN: skin color, texture, turgor are normal, no rashes or significant lesions EYES: normal, Conjunctiva are pink and non-injected, sclera clear OROPHARYNX:no exudate, no erythema and lips, buccal mucosa, and tongue normal  NECK: supple, thyroid normal size, non-tender, without nodularity LYMPH:  no palpable lymphadenopathy in the cervical, axillary or inguinal LUNGS: clear to auscultation and percussion with normal breathing effort HEART: regular rate & rhythm and no murmurs and no lower extremity edema ABDOMEN:abdomen soft, non-tender and normal bowel sounds MUSCULOSKELETAL:no cyanosis of digits and no clubbing  NEURO: alert & oriented x 3 with fluent speech, no focal motor/sensory deficits EXTREMITIES: No lower extremity edema  LABORATORY DATA:  I have reviewed the data as listed   Chemistry   No results found for: NA, K, CL, CO2, BUN, CREATININE, GLU No results found for: CALCIUM, ALKPHOS, AST, ALT, BILITOT     No results found for: WBC, HGB, HCT, MCV, PLT, NEUTROABS  ASSESSMENT & PLAN:  Ductal  carcinoma in situ (DCIS) of left breast 06/12/2017: Left Mastectomy: Low-grade DCIS 2.7 cm, margins negative, ER and a pleasant, PR and a pleasant, Tis Nx stage 0  Pathology counseling: I discussed the final pathology report of the patient provided  a copy of this report. I discussed the margins. We also discussed the final staging along with previously performed ER/PR and testing.  Treatment plan: antiestrogen therapy with tamoxifen 5 years Patient has previously been counseled regarding the risks and benefits of tamoxifen. She is very concerned about the uterine problems. I counseled her extensively about the risks anbeefits of tamoxifen therapy.  For surveillance, because the patient has breast density category D, we may plan to do MRI of the breasts every other year.  Return to clinic in 3 months to assess tolerability to tamoxifen at survivorship care visit.  I spent 25 minutes talking to the patient of which more than half was spent in counseling and coordination of care.  No orders of the defined types were placed in this encounter.  The patient has a good understanding of the overall plan. she agrees with it. she will call with any problems that may develop before the next visit here.   Gudena, Vinay K, MD 06/29/17    

## 2017-06-29 NOTE — Telephone Encounter (Signed)
Printed avs and calender for Jan upcoming appointment. Per 9/28 los

## 2017-07-02 ENCOUNTER — Encounter: Payer: Self-pay | Admitting: General Practice

## 2017-07-02 NOTE — Progress Notes (Signed)
Meagan [name]  Meagan [name] by phone per referral from Dr Geralyn Flash nurse.  Pt requested emotional support related to breast cancer concerns.  As a next step, she plans to meet with me at 9:00 am on Friday, October 5 to explore the issues and discern following steps (further appointments, counseling referral, etc).   [place], [place], [facility] Pager [phone] Voicemail [phone]

## 2017-07-03 ENCOUNTER — Encounter (HOSPITAL_BASED_OUTPATIENT_CLINIC_OR_DEPARTMENT_OTHER): Payer: Self-pay | Admitting: *Deleted

## 2017-07-03 NOTE — Progress Notes (Signed)
Patient given Ensure with instruction to drink by 0415, she voiced understanding. Patient very emotional and tearful on face to face interview about re-excision. Provided emotional support and answered questions as appropriate.

## 2017-07-05 ENCOUNTER — Encounter: Payer: Self-pay | Admitting: General Practice

## 2017-07-05 NOTE — H&P (Signed)
Meagan [name] is an 41 y.o. female.   Chief Complaint: DCIS HPI: she is status post a recent left breast nipple sparing mastectomy for DCIS. The nipple margin was close but negative. After a discussion in the multidisciplinary breast  Conference, excision of the left nipple has been recommended. The patient is otherwise without complaints.  Past Medical History:  Diagnosis Date  . Asthma    childhood astha, no problems now  . Cancer ([place]) 05/2017   left breast DCIS  . Family history of breast cancer   . Family history of colon cancer   . Family history of melanoma     Past Surgical History:  Procedure Laterality Date  . APPENDECTOMY  10/02/2001  . BREAST RECONSTRUCTION WITH PLACEMENT OF TISSUE EXPANDER AND FLEX HD (ACELLULAR HYDRATED DERMIS) Left 06/12/2017   Procedure: LEFT BREAST RECONSTRUCTION WITH PLACEMENT OF TISSUE EXPANDERAND ALLODERM;  Surgeon: Irene Limbo, MD;  Location: [place];  Service: Plastics;  Laterality: Left;  . NIPPLE SPARING MASTECTOMY Left 06/12/2017   Procedure: LEFT BREAST NIPPLE SPARING MASTECTOMY;  Surgeon: Coralie Keens, MD;  Location: [facility];  Service: General;  Laterality: Left;  . WISDOM TOOTH EXTRACTION      Family History  Problem Relation Age of Onset  . Cervical cancer Mother 30       adenocarcioma  . Skin cancer Mother        BCC's  . Melanoma Father        dx in his 69s  . Parkinson's disease Maternal Grandmother   . Colon cancer Maternal Grandfather        dx in his 63s  . Skin cancer Paternal Grandfather   . Breast cancer Cousin        mother's maternal cousin - x2, one dx at 58, another in her 60s  . Breast cancer Cousin        mothers paternal first cousin dx in her 37s-60s, and again in her 30s with possible DCIS   Social History:  reports that she has never smoked. She has never used smokeless tobacco. She reports that she drinks alcohol. She reports that she does not use drugs.  Allergies:   Allergies  Allergen Reactions  . Erythromycin Nausea And Vomiting    No prescriptions prior to admission.    No results found for this or any previous visit (from the past 48 hour(s)). No results found.  Review of Systems  All other systems reviewed and are negative.   Height 5\' 6"  (1.676 m), weight 58.1 kg (128 lb), last menstrual period 07/01/2017. Physical Exam  Constitutional: She is oriented to person, place, and time. She appears well-developed and well-nourished. No distress.  HENT:  Head: Normocephalic and atraumatic.  Right Ear: External ear normal.  Left Ear: External ear normal.  Eyes: Conjunctivae are normal. No scleral icterus.  Neck: Normal range of motion. No tracheal deviation present.  Cardiovascular: Normal rate, regular rhythm and normal heart sounds.   Respiratory: Effort normal and breath sounds normal.  GI: Soft. There is no tenderness.  Musculoskeletal: Normal range of motion.  Neurological: She is alert and oriented to person, place, and time.  Skin: Skin is warm and dry.     Assessment/Plan Left breast DCIS status post nipple sparing mastectomy with close nipple margin  Again, she was discussed at length in the multidisciplinary breast cancer conference and excision of the nipple has been recommended despite the negative margins because it is close.  I discussed  this with her in detail and will now proceed with excision of her left nipple in the operating room. We discussed the risks of bleeding, infection, injury to surrounding structures, postoperative recovery, etc. She understands and agrees to proceed  Harl Bowie, MD 07/05/2017, 2:19 PM

## 2017-07-05 NOTE — Progress Notes (Signed)
[place] Spiritual Care Note  LVM with available dates per Tammy's call to request rescheduling tomorrow's appt.   [place], [place], [facility] Pager [phone] Voicemail [phone]

## 2017-07-06 ENCOUNTER — Ambulatory Visit (HOSPITAL_BASED_OUTPATIENT_CLINIC_OR_DEPARTMENT_OTHER): Payer: Managed Care, Other (non HMO) | Admitting: Certified Registered"

## 2017-07-06 ENCOUNTER — Encounter: Payer: Self-pay | Admitting: General Practice

## 2017-07-06 ENCOUNTER — Encounter (HOSPITAL_BASED_OUTPATIENT_CLINIC_OR_DEPARTMENT_OTHER): Payer: Self-pay | Admitting: *Deleted

## 2017-07-06 ENCOUNTER — Encounter (HOSPITAL_BASED_OUTPATIENT_CLINIC_OR_DEPARTMENT_OTHER)
Admission: RE | Disposition: A | Discharge status: Home / Self Care | Payer: Self-pay | Source: Ambulatory Visit | Attending: Surgery

## 2017-07-06 ENCOUNTER — Ambulatory Visit (HOSPITAL_BASED_OUTPATIENT_CLINIC_OR_DEPARTMENT_OTHER)
Admission: RE | Admit: 2017-07-06 | Discharge: 2017-07-06 | Disposition: A | Discharge status: Home / Self Care | Payer: Managed Care, Other (non HMO) | Source: Ambulatory Visit | Attending: Surgery | Admitting: Surgery

## 2017-07-06 DIAGNOSIS — D0512 Intraductal carcinoma in situ of left breast: Secondary | ICD-10-CM | POA: Insufficient documentation

## 2017-07-06 DIAGNOSIS — Z9012 Acquired absence of left breast and nipple: Secondary | ICD-10-CM | POA: Diagnosis not present

## 2017-07-06 DIAGNOSIS — Z803 Family history of malignant neoplasm of breast: Secondary | ICD-10-CM | POA: Insufficient documentation

## 2017-07-06 DIAGNOSIS — Z881 Allergy status to other antibiotic agents status: Secondary | ICD-10-CM | POA: Diagnosis not present

## 2017-07-06 HISTORY — PX: EXCISION OF BREAST LESION: SHX6676

## 2017-07-06 SURGERY — EXCISION OF BREAST LESION
Anesthesia: General | Site: Breast | Laterality: Left

## 2017-07-06 MED ORDER — ACETAMINOPHEN 500 MG PO TABS
ORAL_TABLET | ORAL | Status: AC
  Filled 2017-07-06: qty 2

## 2017-07-06 MED ORDER — CHLORHEXIDINE GLUCONATE CLOTH 2 % EX PADS: 6.0000 | MEDICATED_PAD | Freq: Once | CUTANEOUS | Status: DC

## 2017-07-06 MED ORDER — OXYCODONE HCL 5 MG/5ML PO SOLN: 5.0000 mg | Freq: Once | ORAL | Status: DC | PRN

## 2017-07-06 MED ORDER — LACTATED RINGERS IV SOLN
INTRAVENOUS | Status: DC
  Administered 2017-07-06: 07:00:00 via INTRAVENOUS

## 2017-07-06 MED ORDER — SCOPOLAMINE 1 MG/3DAYS TD PT72: 1.0000 | MEDICATED_PATCH | Freq: Once | TRANSDERMAL | Status: DC | PRN

## 2017-07-06 MED ORDER — ACETAMINOPHEN 500 MG PO TABS
1000.0000 mg | ORAL_TABLET | ORAL | Status: AC
  Administered 2017-07-06: 1000 mg via ORAL

## 2017-07-06 MED ORDER — PROPOFOL 500 MG/50ML IV EMUL
INTRAVENOUS | Status: AC
  Filled 2017-07-06: qty 50

## 2017-07-06 MED ORDER — MIDAZOLAM HCL 2 MG/2ML IJ SOLN
INTRAMUSCULAR | Status: AC
  Filled 2017-07-06: qty 2

## 2017-07-06 MED ORDER — DEXAMETHASONE SODIUM PHOSPHATE 10 MG/ML IJ SOLN
INTRAMUSCULAR | Status: AC
  Filled 2017-07-06: qty 1

## 2017-07-06 MED ORDER — MIDAZOLAM HCL 2 MG/2ML IJ SOLN
1.0000 mg | INTRAMUSCULAR | Status: DC | PRN
  Administered 2017-07-06: 2 mg via INTRAVENOUS

## 2017-07-06 MED ORDER — LIDOCAINE 2% (20 MG/ML) 5 ML SYRINGE
INTRAMUSCULAR | Status: AC
  Filled 2017-07-06: qty 5

## 2017-07-06 MED ORDER — PROPOFOL 10 MG/ML IV BOLUS
INTRAVENOUS | Status: DC | PRN
  Administered 2017-07-06: 150 mg via INTRAVENOUS

## 2017-07-06 MED ORDER — ONDANSETRON HCL 4 MG/2ML IJ SOLN
INTRAMUSCULAR | Status: AC
  Filled 2017-07-06: qty 8

## 2017-07-06 MED ORDER — CEFAZOLIN SODIUM-DEXTROSE 2-4 GM/100ML-% IV SOLN
2.0000 g | INTRAVENOUS | Status: AC
  Administered 2017-07-06: 2 g via INTRAVENOUS

## 2017-07-06 MED ORDER — DEXAMETHASONE SODIUM PHOSPHATE 4 MG/ML IJ SOLN
INTRAMUSCULAR | Status: DC | PRN
  Administered 2017-07-06: 10 mg via INTRAVENOUS

## 2017-07-06 MED ORDER — LACTATED RINGERS IV SOLN: INTRAVENOUS | Status: DC

## 2017-07-06 MED ORDER — BUPIVACAINE-EPINEPHRINE 0.5% -1:200000 IJ SOLN
INTRAMUSCULAR | Status: DC | PRN
  Administered 2017-07-06: 3 mL

## 2017-07-06 MED ORDER — ONDANSETRON HCL 4 MG/2ML IJ SOLN
INTRAMUSCULAR | Status: DC | PRN
  Administered 2017-07-06: 4 mg via INTRAVENOUS

## 2017-07-06 MED ORDER — FENTANYL CITRATE (PF) 100 MCG/2ML IJ SOLN
50.0000 ug | INTRAMUSCULAR | Status: DC | PRN
  Administered 2017-07-06: 100 ug via INTRAVENOUS

## 2017-07-06 MED ORDER — HYDROMORPHONE HCL 1 MG/ML IJ SOLN: 0.2500 mg | INTRAMUSCULAR | Status: DC | PRN

## 2017-07-06 MED ORDER — BUPIVACAINE-EPINEPHRINE (PF) 0.5% -1:200000 IJ SOLN
INTRAMUSCULAR | Status: AC
  Filled 2017-07-06: qty 30

## 2017-07-06 MED ORDER — MEPERIDINE HCL 25 MG/ML IJ SOLN: 6.2500 mg | INTRAMUSCULAR | Status: DC | PRN

## 2017-07-06 MED ORDER — CEFAZOLIN SODIUM-DEXTROSE 2-4 GM/100ML-% IV SOLN
INTRAVENOUS | Status: AC
  Filled 2017-07-06: qty 100

## 2017-07-06 MED ORDER — CELECOXIB 200 MG PO CAPS
200.0000 mg | ORAL_CAPSULE | ORAL | Status: AC
  Administered 2017-07-06: 200 mg via ORAL

## 2017-07-06 MED ORDER — SCOPOLAMINE 1 MG/3DAYS TD PT72
MEDICATED_PATCH | TRANSDERMAL | Status: AC
  Filled 2017-07-06: qty 1

## 2017-07-06 MED ORDER — PROMETHAZINE HCL 25 MG/ML IJ SOLN: 6.2500 mg | INTRAMUSCULAR | Status: DC | PRN

## 2017-07-06 MED ORDER — FENTANYL CITRATE (PF) 100 MCG/2ML IJ SOLN
INTRAMUSCULAR | Status: AC
  Filled 2017-07-06: qty 2

## 2017-07-06 MED ORDER — CELECOXIB 200 MG PO CAPS
ORAL_CAPSULE | ORAL | Status: AC
  Filled 2017-07-06: qty 1

## 2017-07-06 MED ORDER — OXYCODONE HCL 5 MG PO TABS: 5.0000 mg | ORAL_TABLET | Freq: Once | ORAL | Status: DC | PRN

## 2017-07-06 SURGICAL SUPPLY — 40 items
BLADE HEX COATED 2.75 (ELECTRODE) ×2 IMPLANT
BLADE SURG 15 STRL LF DISP TIS (BLADE) ×1 IMPLANT
BLADE SURG 15 STRL SS (BLADE) ×1
CANISTER SUCT 1200ML W/VALVE (MISCELLANEOUS) IMPLANT
CHLORAPREP W/TINT 26ML (MISCELLANEOUS) ×2 IMPLANT
CLIP VESOCCLUDE SM WIDE 6/CT (CLIP) IMPLANT
COVER BACK TABLE 60X90IN (DRAPES) ×2 IMPLANT
COVER MAYO STAND STRL (DRAPES) ×2 IMPLANT
DECANTER SPIKE VIAL GLASS SM (MISCELLANEOUS) IMPLANT
DERMABOND ADVANCED (GAUZE/BANDAGES/DRESSINGS) ×1
DERMABOND ADVANCED .7 DNX12 (GAUZE/BANDAGES/DRESSINGS) ×1 IMPLANT
DEVICE DUBIN W/COMP PLATE 8390 (MISCELLANEOUS) IMPLANT
DRAPE LAPAROTOMY 100X72 PEDS (DRAPES) ×2 IMPLANT
DRAPE UTILITY XL STRL (DRAPES) ×2 IMPLANT
ELECT REM PT RETURN 9FT ADLT (ELECTROSURGICAL) ×2
ELECTRODE REM PT RTRN 9FT ADLT (ELECTROSURGICAL) ×1 IMPLANT
GAUZE SPONGE 4X4 12PLY STRL LF (GAUZE/BANDAGES/DRESSINGS) ×2 IMPLANT
GLOVE BIO SURGEON STRL SZ7 (GLOVE) ×2 IMPLANT
GLOVE EXAM NITRILE MD LF STRL (GLOVE) ×2 IMPLANT
GLOVE SURG SIGNA 7.5 PF LTX (GLOVE) ×2 IMPLANT
GOWN STRL REUS W/ TWL LRG LVL3 (GOWN DISPOSABLE) IMPLANT
GOWN STRL REUS W/ TWL XL LVL3 (GOWN DISPOSABLE) ×2 IMPLANT
GOWN STRL REUS W/TWL LRG LVL3 (GOWN DISPOSABLE)
GOWN STRL REUS W/TWL XL LVL3 (GOWN DISPOSABLE) ×2
KIT MARKER MARGIN INK (KITS) ×2 IMPLANT
NEEDLE HYPO 25X1 1.5 SAFETY (NEEDLE) ×2 IMPLANT
NS IRRIG 1000ML POUR BTL (IV SOLUTION) ×2 IMPLANT
PACK BASIN DAY SURGERY FS (CUSTOM PROCEDURE TRAY) ×2 IMPLANT
PENCIL BUTTON HOLSTER BLD 10FT (ELECTRODE) ×2 IMPLANT
SLEEVE SCD COMPRESS KNEE MED (MISCELLANEOUS) ×2 IMPLANT
SPONGE LAP 4X18 X RAY DECT (DISPOSABLE) ×2 IMPLANT
SUT MNCRL AB 4-0 PS2 18 (SUTURE) ×2 IMPLANT
SUT SILK 2 0 SH (SUTURE) IMPLANT
SUT VIC AB 3-0 SH 27 (SUTURE) ×1
SUT VIC AB 3-0 SH 27X BRD (SUTURE) ×1 IMPLANT
SYR CONTROL 10ML LL (SYRINGE) ×2 IMPLANT
TOWEL OR 17X24 6PK STRL BLUE (TOWEL DISPOSABLE) ×2 IMPLANT
TOWEL OR NON WOVEN STRL DISP B (DISPOSABLE) ×2 IMPLANT
TUBE CONNECTING 20X1/4 (TUBING) IMPLANT
YANKAUER SUCT BULB TIP NO VENT (SUCTIONS) IMPLANT

## 2017-07-06 NOTE — Transfer of Care (Signed)
Immediate Anesthesia Transfer of Care Note  Patient: Meagan [name]  Procedure(s) Performed: EXCISION OF LEFT BREAST NIPPLE (Left Breast)  Patient Location: PACU  Anesthesia Type:General  Level of Consciousness: awake and patient cooperative  Airway & Oxygen Therapy: Patient Spontanous Breathing and Patient connected to face mask oxygen  Post-op Assessment: Report given to RN and Post -op Vital signs reviewed and stable  Post vital signs: Reviewed and stable  Last Vitals:  Vitals:   07/06/17 0640  BP: 107/66  Pulse: (!) 55  Resp: 18  Temp: 36.7 C  SpO2: 100%    Last Pain:  Vitals:   07/06/17 0640  TempSrc: Oral      Patients Stated Pain Goal: 0 (77/41/42 3953)  Complications: No apparent anesthesia complications

## 2017-07-06 NOTE — Anesthesia Postprocedure Evaluation (Signed)
Anesthesia Post Note  Patient: Meagan [name]  Procedure(s) Performed: EXCISION OF LEFT BREAST NIPPLE (Left Breast)     Patient location during evaluation: PACU Anesthesia Type: General Level of consciousness: awake and alert Pain management: pain level controlled Vital Signs Assessment: post-procedure vital signs reviewed and stable Respiratory status: spontaneous breathing, nonlabored ventilation, respiratory function stable and patient connected to nasal cannula oxygen Cardiovascular status: blood pressure returned to baseline and stable Postop Assessment: no apparent nausea or vomiting Anesthetic complications: no    Last Vitals:  Vitals:   07/06/17 0815 07/06/17 0850  BP: 114/70 125/74  Pulse: 74 67  Resp: 18 16  Temp:  (!) 36.4 C  SpO2: 100% 100%    Last Pain:  Vitals:   07/06/17 0850  TempSrc: Oral  PainSc: [place]

## 2017-07-06 NOTE — Progress Notes (Signed)
[facility] Spiritual Care Note  Rescheduled appt to Monday, October 8 at [place], [place], [facility] Pager [phone] Voicemail [phone]

## 2017-07-06 NOTE — Op Note (Signed)
EXCISION OF LEFT BREAST NIPPLE  Procedure Note  Meagan [name] 07/06/2017   Pre-op Diagnosis: LEFT BREAST DCIS WITH CLOSE MARGIN     Post-op Diagnosis: same  Procedure(s): EXCISION OF LEFT BREAST NIPPLE  Surgeon(s): Coralie Keens, MD  Anesthesia: General  Staff:  Circulator: Eston Esters, RN Scrub Person: Haydee Monica Circulator Assistant: Harrel Lemon, RN  Estimated Blood Loss: Minimal               Specimens: sent to path  Indications:  She is status post a nipple sparing mastectomy left side for DCIS. The nipple margin was close so after discussion, decision has been made to excise the nipple  Procedure: The patient was brought to the operating room and identified as the correct patient. She was placed supine on the operating table and general anesthesia was induced. Her left breast was prepped and draped in usual sterile fashion. I anesthetized the skin around the nipple areolar complex with Marcaine. I then made an elliptical incision transversely incorporating the nipple and some of the areola. I then excised the nipple completely going down into the underlying tissue with cautery. This was sent to pathology for evaluation. I closed subjacent tissue with interrupted 3-0 Vicryl sutures and closed the skin with a running 4-0 Monocryl. Dermabond was then applied. The patient tolerated the procedure well. All the counts were correct at the end of the procedure. The patient was then asked abated in the operating room and taken in a stable condition to the recovery room.          Meagan [name] A   Date: 07/06/2017  Time: 7:58 AM

## 2017-07-06 NOTE — Anesthesia Preprocedure Evaluation (Addendum)
Anesthesia Evaluation  Patient identified by MRN, date of birth, ID band Patient awake    Reviewed: Allergy & Precautions, NPO status , Patient's Chart, lab work & pertinent test results  Airway Mallampati: I  TM Distance: >3 FB Neck ROM: Full    Dental  (+) Teeth Intact, Dental Advisory Given   Pulmonary asthma ,    breath sounds clear to auscultation       Cardiovascular negative cardio ROS   Rhythm:Regular Rate:Normal     Neuro/Psych negative neurological ROS     GI/Hepatic negative GI ROS, Neg liver ROS,   Endo/Other  negative endocrine ROS  Renal/GU negative Renal ROS     Musculoskeletal negative musculoskeletal ROS (+)   Abdominal   Peds  Hematology negative hematology ROS (+)   Anesthesia Other Findings Day of surgery medications reviewed with the patient.  Reproductive/Obstetrics                            Anesthesia Physical Anesthesia Plan  ASA: II  Anesthesia Plan: General   Post-op Pain Management:    Induction: Intravenous  PONV Risk Score and Plan: 4 or greater and Ondansetron, Dexamethasone, Midazolam and Scopolamine patch - Pre-op  Airway Management Planned: LMA  Additional Equipment:   Intra-op Plan:   Post-operative Plan: Extubation in OR  Informed Consent: I have reviewed the patients History and Physical, chart, labs and discussed the procedure including the risks, benefits and alternatives for the proposed anesthesia with the patient or authorized representative who has indicated his/her understanding and acceptance.   Dental advisory given  Plan Discussed with: CRNA  Anesthesia Plan Comments:         Anesthesia Quick Evaluation

## 2017-07-06 NOTE — Anesthesia Procedure Notes (Signed)
Procedure Name: LMA Insertion Date/Time: 07/06/2017 7:33 AM Performed by: Synai Prettyman D Pre-anesthesia Checklist: Patient identified, Emergency Drugs available, Suction available and Patient being monitored Patient Re-evaluated:Patient Re-evaluated prior to induction Oxygen Delivery Method: Circle system utilized Preoxygenation: Pre-oxygenation with 100% oxygen Induction Type: IV induction Ventilation: Mask ventilation without difficulty LMA: LMA inserted LMA Size: 3.0 Number of attempts: 1 Airway Equipment and Method: Bite block Placement Confirmation: positive ETCO2 Tube secured with: Tape Dental Injury: Teeth and Oropharynx as per pre-operative assessment

## 2017-07-06 NOTE — Discharge Instructions (Signed)
°  Post Anesthesia Home Care Instructions  Activity: Get plenty of rest for the remainder of the day. A responsible individual must stay with you for 24 hours following the procedure.  For the next 24 hours, DO NOT: -Drive a car -Paediatric nurse -Drink alcoholic beverages -Take any medication unless instructed by your physician -Make any legal decisions or sign important papers.  Meals: Start with liquid foods such as gelatin or soup. Progress to regular foods as tolerated. Avoid greasy, spicy, heavy foods. If nausea and/or vomiting occur, drink only clear liquids until the nausea and/or vomiting subsides. Call your physician if vomiting continues.  Special Instructions/Symptoms: Your throat may feel dry or sore from the anesthesia or the breathing tube placed in your throat during surgery. If this causes discomfort, gargle with warm salt water. The discomfort should disappear within 24 hours.  If you had a scopolamine patch placed behind your ear for the management of post- operative nausea and/or vomiting:  1. The medication in the patch is effective for 72 hours, after which it should be removed.  Wrap patch in a tissue and discard in the trash. Wash hands thoroughly with soap and water. 2. You may remove the patch earlier than 72 hours if you experience unpleasant side effects which may include dry mouth, dizziness or visual disturbances. 3. Avoid touching the patch. Wash your hands with soap and water after contact with the patch.      Ok to shower starting tomorrow  No vigorous activity for one

## 2017-07-06 NOTE — Interval H&P Note (Signed)
History and Physical Interval Note: no change in H and P  07/06/2017 6:41 AM  Meagan [name]  has presented today for surgery, with the diagnosis of LEFT BREAST DCIS WITH CLOSE MARGIN  The various methods of treatment have been discussed with the patient and family. After consideration of risks, benefits and other options for treatment, the patient has consented to  Procedure(s): EXCISION OF LEFT BREAST NIPPLE (Left) as a surgical intervention .  The patient's history has been reviewed, patient examined, no change in status, stable for surgery.  I have reviewed the patient's chart and labs.  Questions were answered to the patient's satisfaction.     Gregor Dershem A

## 2017-07-09 ENCOUNTER — Encounter (HOSPITAL_BASED_OUTPATIENT_CLINIC_OR_DEPARTMENT_OTHER): Payer: Self-pay | Admitting: Surgery

## 2017-07-09 ENCOUNTER — Encounter: Payer: Self-pay | Admitting: General Practice

## 2017-07-09 NOTE — Progress Notes (Signed)
[facility] Spiritual Care Note  Met with Meagan [name] for ca 80 min as she shared and processed her "list of grievances" (her term for the list of emotional/physical concerns she is dealing with related to diagnosis, treatment, fear of recurrence, and how they shape her life/identity/self-concept/etc).  Provided empathic listening, pastoral reflection, normalization of feelings, naming of strengths, opportunities for reframing, and suggestions of resources ([facility] for helping esp older child [Maddie, age 41] cope and interpret, [facility] [Finding Your New Normal survivorship series]).  Meagan [name] plans to reflect on this experience, meet with Lauren Somers/LCSW on Thursday, and then consider next steps for pursuing support.  She knows to contact Support Team anytime for further appointments, resources, etc.   Pauline Good, [facility] Pager [phone] Voicemail [phone]

## 2017-07-10 ENCOUNTER — Ambulatory Visit: Payer: Managed Care, Other (non HMO) | Attending: Plastic Surgery | Admitting: Physical Therapy

## 2017-07-10 DIAGNOSIS — M25612 Stiffness of left shoulder, not elsewhere classified: Secondary | ICD-10-CM | POA: Diagnosis present

## 2017-07-10 DIAGNOSIS — Z483 Aftercare following surgery for neoplasm: Secondary | ICD-10-CM | POA: Insufficient documentation

## 2017-07-10 DIAGNOSIS — M25512 Pain in left shoulder: Secondary | ICD-10-CM | POA: Insufficient documentation

## 2017-07-10 DIAGNOSIS — M6281 Muscle weakness (generalized): Secondary | ICD-10-CM | POA: Diagnosis present

## 2017-07-10 NOTE — Therapy (Signed)
[place], [place], [address] Phone: [phone]   Fax:  [phone]  Physical Therapy Evaluation  Patient Details  Name: Meagan [name] MRN: [id number] Date of Birth: Jul 31, 1976 Referring Provider: Thimmappa   Encounter Date: 07/10/2017      PT End of Session - 07/10/17 1208    Visit Number 1   Number of Visits 9   Date for PT Re-Evaluation 08/10/17   PT Start Time 1100   PT Stop Time 1145   PT Time Calculation (min) 45 min   Activity Tolerance Patient tolerated treatment well   Behavior During Therapy WFL for tasks assessed/performed      Past Medical History:  Diagnosis Date  . Asthma    childhood astha, no problems now  . Cancer ([place]) 05/2017   left breast DCIS  . Family history of breast cancer   . Family history of colon cancer   . Family history of melanoma     Past Surgical History:  Procedure Laterality Date  . APPENDECTOMY  10/02/2001  . BREAST RECONSTRUCTION WITH PLACEMENT OF TISSUE EXPANDER AND FLEX HD (ACELLULAR HYDRATED DERMIS) Left 06/12/2017   Procedure: LEFT BREAST RECONSTRUCTION WITH PLACEMENT OF TISSUE EXPANDERAND ALLODERM;  Surgeon: Irene Limbo, MD;  Location: [facility];  Service: Plastics;  Laterality: Left;  . EXCISION OF BREAST LESION Left 07/06/2017   Procedure: EXCISION OF LEFT BREAST NIPPLE;  Surgeon: Coralie Keens, MD;  Location: [facility];  Service: General;  Laterality: Left;  . NIPPLE SPARING MASTECTOMY Left 06/12/2017   Procedure: LEFT BREAST NIPPLE SPARING MASTECTOMY;  Surgeon: Coralie Keens, MD;  Location: [facility];  Service: General;  Laterality: Left;  . WISDOM TOOTH EXTRACTION      There were no vitals filed for this visit.       Subjective Assessment - 07/10/17 1111    Subjective fatigue and pain at the end of the day. with some pain at tissue expander lateral chest    Pertinent History Pt  with Nipple Sparing Mastectomy with no lymph nodes removed 06/12/2017. (Dr. Ninfa Linden)  Immediate placeement of expander (Dr. Iran Planas) and has 2 fills so far, Surgery for nipple resection  on Oct. 5. Pt will not have to have  chemo or radiation     Patient Stated Goals to get range of motion back learn more about exercise progression as pt wants to return to previous exerise    Currently in Pain? Yes   Pain Score 2    Pain Location Breast   Pain Orientation Left   Pain Descriptors / Indicators Sharp;Pins and needles   Pain Type Surgical pain   Pain Onset In the past 7 days   Pain Frequency Intermittent   Aggravating Factors  fills   Pain Relieving Factors pain meds    Effect of Pain on Daily Activities limts household activities requiring arm strength             [facility] PT Assessment - 07/10/17 0001      Assessment   Medical Diagnosis left breast cancer    Referring Provider Thimmappa    Onset Date/Surgical Date 06/12/17   Hand Dominance Left     Precautions   Precautions Other (comment)     Restrictions   Weight Bearing Restrictions No     Balance Screen   Has the patient fallen in the past 6 months No   Has the patient had a decrease in activity level because  of a fear of falling?  No   Is the patient reluctant to leave their home because of a fear of falling?  No     Home Environment   Living Environment Private residence   Living Arrangements Spouse/significant other;Children  13 yo and 44 yo   Available Help at Discharge Available PRN/intermittently     Prior Function   Level of Independence Independent   Vocation Other (comment)  on medical leave, maybe part time next week    [employer] , mostly on computer    Leisure Pilates, stregth and interval training.      Cognition   Overall Cognitive Status Within Functional Limits for tasks assessed     Observation/Other Assessments   Observations thin female who is independent in functional  mobility    Skin Integrity pt reports she is healing well, did not inspect skin    Quick DASH  36.36     Coordination   Gross Motor Movements are Fluid and Coordinated Yes     Posture/Postural Control   Posture/Postural Control No significant limitations     AROM   Overall AROM Comments symmetrical scapular movment    Right Shoulder Flexion 175 Degrees   Right Shoulder ABduction 180 Degrees   Right Shoulder External Rotation 90 Degrees   Left Shoulder Flexion 149 Degrees   Left Shoulder ABduction 158 Degrees   Left Shoulder External Rotation 85 Degrees     Strength   Overall Strength Comments tightness and pain at end range of left shoulder range of motion.  Pt has more limitation of moving her left arm when she is in a right sidelying position    Right Shoulder Flexion 5/5   Right Shoulder ABduction 5/5   Left Shoulder Flexion 3-/5  limited by pain and tightness at end range    Left Shoulder ABduction 3-/5  limited by pain an tightness at end range    Right Hand Grip (lbs) 58/55/50   Left Hand Grip (lbs) 38/30/31  ave 33               Quick Dash - 07/10/17 0001    Open a tight or new jar Unable   Do heavy household chores (wash walls, wash floors) Moderate difficulty   Carry a shopping bag or briefcase Moderate difficulty   Wash your back Moderate difficulty   Use a knife to cut food Mild difficulty   Recreational activities in which you take some force or impact through your arm, shoulder, or hand (golf, hammering, tennis) Moderate difficulty   During the past week, to what extent has your arm, shoulder or hand problem interfered with your normal social activities with family, friends, neighbors, or groups? Slightly   During the past week, to what extent has your arm, shoulder or hand problem limited your work or other regular daily activities Modererately   Arm, shoulder, or hand pain. None   Tingling (pins and needles) in your arm, shoulder, or hand None    Difficulty Sleeping No difficulty   DASH Score 36.36 %      Objective measurements completed on examination: See above findings.          [facility] Adult PT Treatment/Exercise - 07/10/17 0001      Exercises   Exercises Shoulder     Shoulder Exercises: Supine   Other Supine Exercises dowel rod for flexion and abduction                PT Education -  07/10/17 1157    Education provided Yes   Education Details instructed in dowel rod exercise and issued ABC handout for illustrations of exercise    Person(s) Educated Patient   Methods Explanation   Comprehension Verbalized understanding;Returned demonstration                [facility] Goals - 07/10/17 1214      CC Long Term Goal  #1   Title Pt will reduce Quick DASH Score to < 10 demonstrating improved function of left arm    Baseline 36.36   Time 4   Period Weeks   Status New     CC Long Term Goal  #2   Title Pt will increase left grep strength to an average of 45 indicating and improvment of left UE stretngth    Baseline 33   Time 4   Period Weeks   Status New     CC Long Term Goal  #3   Title Pt will be independent in a home exercise program for strength and ROM of left arm    Time 4   Period Weeks   Status New             Plan - 07/10/17 1208    Clinical Impression Statement Pt is experiencing pain and tightness in left upper quadrant after mastectomy and nipple resection with immediate expander and fills.  She has decreased strength and is limited in household activities.    History and Personal Factors relevant to plan of care: 2 surgeries in past month, ongoing fills of expander    Clinical Presentation Evolving   Clinical Presentation due to: pt is undergoing fills and will have breast implant placement    Clinical Decision Making Moderate   Rehab Potential Good   Clinical Impairments Affecting Rehab Potential ongoing expander fills    PT Frequency 2x / week   PT Duration 4  weeks   PT Treatment/Interventions ADLs/Self Care Home Management;Patient/family education;Functional mobility training;Therapeutic activities;Manual techniques;Therapeutic exercise;Neuromuscular re-education;Passive range of motion   PT Next Visit Plan try pulleys, wall stretches and ball up the wall, supine pec stretches over folded towel or pillows, sidelying ROM and supince scap.  Passive ROM and stretching     Consulted and Agree with Plan of Care Patient      Patient will benefit from skilled therapeutic intervention in order to improve the following deficits and impairments:  Decreased endurance, Decreased knowledge of precautions, Decreased activity tolerance, Decreased knowledge of use of DME, Decreased strength, Impaired UE functional use, Pain, Increased muscle spasms, Decreased range of motion  Visit Diagnosis: Aftercare following surgery for neoplasm - Plan: PT plan of care cert/re-cert  Stiffness of left shoulder joint - Plan: PT plan of care cert/re-cert  Acute pain of left shoulder - Plan: PT plan of care cert/re-cert  Muscle weakness (generalized) - Plan: PT plan of care cert/re-cert     Problem List Patient Active Problem List   Diagnosis Date Noted  . Breast cancer, left breast ([place]) 06/12/2017  . Genetic testing 04/26/2017  . Family history of breast cancer   . Family history of melanoma   . Family history of colon cancer   . 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 Carcinoma of upper-outer quadrant of left female breast ([place]) 04/13/2017   Donato Heinz. Owens Shark PT  Norwood Levo 07/10/2017, 12:36 PM  [place], [place], [address] Phone:  [phone]   Fax:  [phone]  Name: Meagan [name] MRN: [id number] Date of Birth: 02/14/1976

## 2017-07-11 ENCOUNTER — Encounter: Payer: Self-pay | Admitting: *Deleted

## 2017-07-12 ENCOUNTER — Ambulatory Visit: Payer: Managed Care, Other (non HMO) | Admitting: Physical Therapy

## 2017-07-12 DIAGNOSIS — Z483 Aftercare following surgery for neoplasm: Secondary | ICD-10-CM

## 2017-07-12 DIAGNOSIS — M6281 Muscle weakness (generalized): Secondary | ICD-10-CM

## 2017-07-12 DIAGNOSIS — M25512 Pain in left shoulder: Secondary | ICD-10-CM

## 2017-07-12 DIAGNOSIS — M25612 Stiffness of left shoulder, not elsewhere classified: Secondary | ICD-10-CM

## 2017-07-12 NOTE — Patient Instructions (Signed)
1. Decompression Exercise     Cancer Rehab [id number]    Lie on back on firm surface, knees bent, feet flat, arms turned up, out to sides, backs of hands down. Time _5-15__ minutes. Surface: floor   2. Shoulder Press    Start in Decompression Exercise position. Press shoulders downward towards supporting surface. Hold __2-3__ seconds while counting out loud. Repeat _3-5___ times. Do _1-2___ times per day.   3. Head Press    Bring cervical spine (neck) into neutral position (by either tucking the chin towards the chest or tilting the chin upward). Feel weight on back of head. Press head downward into supporting surface.    Hold _2-3__ seconds. Repeat _3-5__ times. Do _1-2__ times per day.   4. Leg Lengthener    Straighten one leg. Pull toes AND forefoot toward knee, extend heel. Lengthen leg by pulling pelvis away from ribs. Hold _2-3__ seconds. Relax. Repeat __4-6__ times. Do other leg.  Surface: floor   5. Leg Press    Straighten one leg down to floor keeping leg aligned with hip. Pull toes AND forefoot toward knee; extend heel.  Press entire leg downward (as if pressing leg into sandy beach). DO NOT BEND KNEE. Hold _2-3__ seconds. Do __4-6__ times. Repeat with other leg.   START WITH NO WEIGHTS WHEN NO LONGER PAINFUL, START WITH YELLOW THERABAND  Over Head Pull: Narrow and Wide Grip   Cancer Rehab [phone]   On back, knees bent, feet flat, band across thighs, elbows straight but relaxed. Pull hands apart (start). Keeping elbows straight, bring arms up and over head, hands toward floor. Keep pull steady on band. Hold momentarily. Return slowly, keeping pull steady, back to start. Then do same with a wider grip on the band (past shoulder width) Repeat _5-10__ times. Band color __yellow____   Side Pull: Double Arm   On back, knees bent, feet flat. Arms perpendicular to body, shoulder level, elbows straight but relaxed. Pull arms out to sides, elbows straight. Resistance band  comes across collarbones, hands toward floor. Hold momentarily. Slowly return to starting position. Repeat _5-10__ times. Band color _yellow____   Sword   On back, knees bent, feet flat, left hand on left hip, right hand above left. Pull right arm DIAGONALLY (hip to shoulder) across chest. Bring right arm along head toward floor. Hold momentarily. Slowly return to starting position. Repeat _5-10__ times. Do with left arm. Band color _yellow_____   Shoulder Rotation: Double Arm   On back, knees bent, feet flat, elbows tucked at sides, bent 90, hands palms up. Pull hands apart and down toward floor, keeping elbows near sides. Hold momentarily. Slowly return to starting position. Repeat _5-10__ times. Band color __yellow____

## 2017-07-12 NOTE — Therapy (Signed)
[place], [place], [address] Phone: [phone]   Fax:  [phone]  Physical Therapy Treatment  Patient Details  Name: Meagan [name] MRN: [id number] Date of Birth: June 14, 1976 Referring Provider: Thimmappa   Encounter Date: 07/12/2017      PT End of Session - 07/12/17 1219    Visit Number 2   Number of Visits 9   Date for PT Re-Evaluation 08/10/17   PT Start Time 1016   PT Stop Time 1100   PT Time Calculation (min) 44 min   Activity Tolerance Patient tolerated treatment well   Behavior During Therapy WFL for tasks assessed/performed      Past Medical History:  Diagnosis Date  . Asthma    childhood astha, no problems now  . Cancer ([place]) 05/2017   left breast DCIS  . Family history of breast cancer   . Family history of colon cancer   . Family history of melanoma     Past Surgical History:  Procedure Laterality Date  . APPENDECTOMY  10/02/2001  . BREAST RECONSTRUCTION WITH PLACEMENT OF TISSUE EXPANDER AND FLEX HD (ACELLULAR HYDRATED DERMIS) Left 06/12/2017   Procedure: LEFT BREAST RECONSTRUCTION WITH PLACEMENT OF TISSUE EXPANDERAND ALLODERM;  Surgeon: Irene Limbo, MD;  Location: [facility];  Service: Plastics;  Laterality: Left;  . EXCISION OF BREAST LESION Left 07/06/2017   Procedure: EXCISION OF LEFT BREAST NIPPLE;  Surgeon: Coralie Keens, MD;  Location: [facility];  Service: General;  Laterality: Left;  . NIPPLE SPARING MASTECTOMY Left 06/12/2017   Procedure: LEFT BREAST NIPPLE SPARING MASTECTOMY;  Surgeon: Coralie Keens, MD;  Location: [facility];  Service: General;  Laterality: Left;  . WISDOM TOOTH EXTRACTION      There were no vitals filed for this visit.      Subjective Assessment - 07/12/17 1020    Subjective Had a little pain yesterday . Pt has been doing more household chores and feels like she is having some soreness  after. She is still limited with overhead activities    Pertinent History Pt with Nipple Sparing Mastectomy with no lymph nodes removed 06/12/2017. (Dr. Ninfa Linden)  Immediate placeement of expander (Dr. Iran Planas) and has 2 fills so far, Surgery for nipple resection  on Oct. 5. Pt will not have to have  chemo or radiation     Patient Stated Goals to get range of motion back learn more about exercise progression as pt wants to return to previous exerise    Currently in Pain? No/denies  just doesn't feel normal                          [facility] Adult PT Treatment/Exercise - 07/12/17 0001      Neck Exercises: Seated   Other Seated Exercise slow neck and upper thoracic stretches      Lumbar Exercises: Supine   Other Supine Lumbar Exercises lower trunk rotation with goal post arms with pillow under left forearm for support      Shoulder Exercises: Supine   Other Supine Exercises Meeks decompression series, supine scapular series with no theraband, active movement only      Shoulder Exercises: Seated   Other Seated Exercises UE Ranger in sitting forward and back and in a diagonal with UE ranger on the floor and also on the wall      Shoulder Exercises: Sidelying   External Rotation AROM;Left;10 reps   ABduction AROM;Left;10  reps  pt only able to achieve 90 degrees of abduction due to pain    Other Sidelying Exercises small circles with hand pointed to the ceiling.      Shoulder Exercises: ROM/Strengthening   Ball on Wall yellow ball up the wall with both hands on the wall with lean into ball on the top , also performed with elbow on the ball, hand on ball for closed chain with small circles with elbow straight.      Manual Therapy   Manual Therapy Passive ROM   Passive ROM attempted PROM in supine, but pt did not tolerate it well                         [facility] Goals - 07/10/17 1214      CC Long Term Goal  #1   Title Pt will reduce Quick DASH  Score to < 10 demonstrating improved function of left arm    Baseline 36.36   Time 4   Period Weeks   Status New     CC Long Term Goal  #2   Title Pt will increase left grep strength to an average of 45 indicating and improvment of left UE stretngth    Baseline 33   Time 4   Period Weeks   Status New     CC Long Term Goal  #3   Title Pt will be independent in a home exercise program for strength and ROM of left arm    Time 4   Period Weeks   Status New            Plan - 07/12/17 1221    Clinical Impression Statement Pt continues have pain with stretching, She does best with active closed chaing activities with ball on wall and UE Ranger.   Did alot with AROM in supine, sidleying and sitting today    PT Next Visit Plan try pulleys, wall stretches and ball up the wall, supine pec stretches over folded towel or pillows, sidelying ROM and supince scap.  Passive ROM and stretching     Consulted and Agree with Plan of Care Patient      Patient will benefit from skilled therapeutic intervention in order to improve the following deficits and impairments:  Decreased endurance, Decreased knowledge of precautions, Decreased activity tolerance, Decreased knowledge of use of DME, Decreased strength, Impaired UE functional use, Pain, Increased muscle spasms, Decreased range of motion  Visit Diagnosis: Aftercare following surgery for neoplasm  Stiffness of left shoulder joint  Acute pain of left shoulder  Muscle weakness (generalized)     Problem List Patient Active Problem List   Diagnosis Date Noted  . Breast cancer, left breast ([place]) 06/12/2017  . Genetic testing 04/26/2017  . Family history of breast cancer   . Family history of melanoma   . Family history of colon cancer   . 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 Owens Shark PT  Norwood Levo 07/12/2017, 12:29  PM  [place], [place], [address] Phone: [phone]   Fax:  [phone]  Name: Kalecia Hartney MRN: [id number] Date of Birth: [birth year]

## 2017-07-13 ENCOUNTER — Encounter (HOSPITAL_BASED_OUTPATIENT_CLINIC_OR_DEPARTMENT_OTHER): Payer: Self-pay | Admitting: *Deleted

## 2017-07-13 ENCOUNTER — Telehealth: Payer: Self-pay

## 2017-07-13 NOTE — Telephone Encounter (Signed)
Changed visit type from est to Survival per 10/10 sch message per provider request

## 2017-07-14 NOTE — H&P (Signed)
  Subjective:     Patient ID: Meagan [name] is a 41 y.o. female.  HPI  4 weeks post op NSM with immediate TE/ADM reconstruction. Now 1 week post nipple resection. Presents today with concern pain at recent incision and concern for deflation expander.    Presented following screening MMG with left breast asymmetries. Diagnostic MMG and [place] with two lesions left breast  12 o'clock 1 cmfn measuring 9 x 1 x 5 mm and at 12:30 2 cmfn measuring 5 x 2 x 6 mm. [place] with normal axilla. Biopsy both revealed low-grade DCIS, ER/PR+. 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 MRI witho significant enhancement in the left breast above the background enhancement. Final pathology 2.7 cm low grade DCIS, <0.1 cm of anterior nipple margin multifocally, 0.1 cm of posterior margin focally. No SLN done. Nipple resection with no evidence malignancy.  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  Prior < A cup, notes she has to special order bras. Goal size  would be not to have to special order bras. Left mastectomy 94 g  Works from home in [employer].   Review of Systems  Objective:   Physical Exam  Cardiovascular: Normal rate, regular rhythm and normal heart sounds.   Pulmonary/Chest: Effort normal and breath sounds normal.   Chest: left IMF incision scar maturing Apparent deflation left TE Left nipple incision intact with glue in place no drainage     Assessment:     DCIS Left breast  S/p Left NSM TE/ADM (Alloderm) reconstruction S/p left nipple excision Deflation left TE    Plan:      Pictures today. Reviewed with pictures from last week and does appear to have TE deflation. Plan replacement next week. Counseled may replace drain, but OP surgery. Hold PT until further notice. Copy of pathology provided.  Natrelle [id number]-11 T 250 ml tissue expander,  initial fill volume 190 ml +ml = ml total fill volume.   Irene Limbo, MD [facility] Plastic & Reconstructive Surgery [phone], pin [phone]

## 2017-07-16 ENCOUNTER — Ambulatory Visit: Payer: Managed Care, Other (non HMO) | Admitting: Physical Therapy

## 2017-07-16 NOTE — Progress Notes (Signed)
Ensure pre surgery drink given with instructions, pt verbalized understanding. 

## 2017-07-17 ENCOUNTER — Ambulatory Visit (HOSPITAL_BASED_OUTPATIENT_CLINIC_OR_DEPARTMENT_OTHER): Payer: Managed Care, Other (non HMO) | Admitting: Anesthesiology

## 2017-07-17 ENCOUNTER — Encounter (HOSPITAL_BASED_OUTPATIENT_CLINIC_OR_DEPARTMENT_OTHER): Payer: Self-pay | Admitting: Anesthesiology

## 2017-07-17 ENCOUNTER — Encounter (HOSPITAL_BASED_OUTPATIENT_CLINIC_OR_DEPARTMENT_OTHER)
Admission: RE | Disposition: A | Discharge status: Home / Self Care | Payer: Self-pay | Source: Ambulatory Visit | Attending: Plastic Surgery

## 2017-07-17 ENCOUNTER — Ambulatory Visit (HOSPITAL_BASED_OUTPATIENT_CLINIC_OR_DEPARTMENT_OTHER)
Admission: RE | Admit: 2017-07-17 | Discharge: 2017-07-17 | Disposition: A | Discharge status: Home / Self Care | Payer: Managed Care, Other (non HMO) | Source: Ambulatory Visit | Attending: Plastic Surgery | Admitting: Plastic Surgery

## 2017-07-17 DIAGNOSIS — Z9012 Acquired absence of left breast and nipple: Secondary | ICD-10-CM | POA: Diagnosis not present

## 2017-07-17 DIAGNOSIS — N6489 Other specified disorders of breast: Secondary | ICD-10-CM | POA: Insufficient documentation

## 2017-07-17 DIAGNOSIS — D0512 Intraductal carcinoma in situ of left breast: Secondary | ICD-10-CM | POA: Insufficient documentation

## 2017-07-17 DIAGNOSIS — T8549XA Other mechanical complication of breast prosthesis and implant, initial encounter: Secondary | ICD-10-CM | POA: Insufficient documentation

## 2017-07-17 DIAGNOSIS — J45909 Unspecified asthma, uncomplicated: Secondary | ICD-10-CM | POA: Insufficient documentation

## 2017-07-17 DIAGNOSIS — Y812 Prosthetic and other implants, materials and accessory general- and plastic-surgery devices associated with adverse incidents: Secondary | ICD-10-CM | POA: Diagnosis not present

## 2017-07-17 HISTORY — PX: REMOVAL OF TISSUE EXPANDER AND PLACEMENT OF IMPLANT: SHX6457

## 2017-07-17 LAB — POCT PREGNANCY, URINE: Preg Test, Ur: NEGATIVE

## 2017-07-17 SURGERY — REMOVAL, TISSUE EXPANDER, BREAST, WITH IMPLANT INSERTION
Anesthesia: General | Site: Chest | Laterality: Left

## 2017-07-17 MED ORDER — CELECOXIB 200 MG PO CAPS
200.0000 mg | ORAL_CAPSULE | ORAL | Status: AC
  Administered 2017-07-17: 200 mg via ORAL

## 2017-07-17 MED ORDER — ROCURONIUM BROMIDE 10 MG/ML (PF) SYRINGE
PREFILLED_SYRINGE | INTRAVENOUS | Status: AC
  Filled 2017-07-17: qty 5

## 2017-07-17 MED ORDER — FENTANYL CITRATE (PF) 100 MCG/2ML IJ SOLN
50.0000 ug | Freq: Once | INTRAMUSCULAR | Status: AC
  Administered 2017-07-17: 50 ug via INTRAVENOUS

## 2017-07-17 MED ORDER — OXYCODONE HCL 5 MG PO TABS: 5.0000 mg | ORAL_TABLET | ORAL | 0 refills | Status: DC | PRN

## 2017-07-17 MED ORDER — DEXAMETHASONE SODIUM PHOSPHATE 10 MG/ML IJ SOLN
INTRAMUSCULAR | Status: AC
  Filled 2017-07-17: qty 1

## 2017-07-17 MED ORDER — CHLORHEXIDINE GLUCONATE CLOTH 2 % EX PADS: 6.0000 | MEDICATED_PAD | Freq: Once | CUTANEOUS | Status: DC

## 2017-07-17 MED ORDER — ONDANSETRON HCL 4 MG/2ML IJ SOLN
INTRAMUSCULAR | Status: DC | PRN
  Administered 2017-07-17: 4 mg via INTRAVENOUS

## 2017-07-17 MED ORDER — FENTANYL CITRATE (PF) 100 MCG/2ML IJ SOLN
INTRAMUSCULAR | Status: AC
  Filled 2017-07-17: qty 2

## 2017-07-17 MED ORDER — SCOPOLAMINE 1 MG/3DAYS TD PT72
MEDICATED_PATCH | TRANSDERMAL | Status: AC
  Filled 2017-07-17: qty 1

## 2017-07-17 MED ORDER — MIDAZOLAM HCL 2 MG/2ML IJ SOLN
1.0000 mg | INTRAMUSCULAR | Status: DC | PRN
  Administered 2017-07-17: 2 mg via INTRAVENOUS

## 2017-07-17 MED ORDER — CELECOXIB 200 MG PO CAPS
ORAL_CAPSULE | ORAL | Status: AC
  Filled 2017-07-17: qty 1

## 2017-07-17 MED ORDER — ONDANSETRON HCL 4 MG/2ML IJ SOLN
INTRAMUSCULAR | Status: AC
  Filled 2017-07-17: qty 2

## 2017-07-17 MED ORDER — LIDOCAINE HCL (CARDIAC) 20 MG/ML IV SOLN
INTRAVENOUS | Status: DC | PRN
  Administered 2017-07-17: 50 mg via INTRAVENOUS

## 2017-07-17 MED ORDER — GABAPENTIN 300 MG PO CAPS
ORAL_CAPSULE | ORAL | Status: AC
  Filled 2017-07-17: qty 1

## 2017-07-17 MED ORDER — SODIUM CHLORIDE 0.9 % IV SOLN
INTRAVENOUS | Status: DC | PRN
  Administered 2017-07-17: 500 mL

## 2017-07-17 MED ORDER — CEFAZOLIN SODIUM-DEXTROSE 2-4 GM/100ML-% IV SOLN
INTRAVENOUS | Status: AC
  Filled 2017-07-17: qty 100

## 2017-07-17 MED ORDER — FENTANYL CITRATE (PF) 100 MCG/2ML IJ SOLN
25.0000 ug | INTRAMUSCULAR | Status: DC | PRN
  Administered 2017-07-17: 25 ug via INTRAVENOUS
  Administered 2017-07-17: 50 ug via INTRAVENOUS
  Administered 2017-07-17: 25 ug via INTRAVENOUS
  Administered 2017-07-17: 50 ug via INTRAVENOUS

## 2017-07-17 MED ORDER — PROPOFOL 10 MG/ML IV BOLUS
INTRAVENOUS | Status: DC | PRN
  Administered 2017-07-17: 100 mg via INTRAVENOUS

## 2017-07-17 MED ORDER — SULFAMETHOXAZOLE-TRIMETHOPRIM 800-160 MG PO TABS: 1.0000 | ORAL_TABLET | Freq: Two times a day (BID) | ORAL | 0 refills | Status: DC

## 2017-07-17 MED ORDER — MIDAZOLAM HCL 2 MG/2ML IJ SOLN
INTRAMUSCULAR | Status: AC
  Filled 2017-07-17: qty 2

## 2017-07-17 MED ORDER — EPHEDRINE SULFATE 50 MG/ML IJ SOLN
INTRAMUSCULAR | Status: DC | PRN
  Administered 2017-07-17: 15 mg via INTRAVENOUS
  Administered 2017-07-17: 10 mg via INTRAVENOUS

## 2017-07-17 MED ORDER — GABAPENTIN 300 MG PO CAPS
300.0000 mg | ORAL_CAPSULE | ORAL | Status: AC
  Administered 2017-07-17: 300 mg via ORAL

## 2017-07-17 MED ORDER — ACETAMINOPHEN 500 MG PO TABS
ORAL_TABLET | ORAL | Status: AC
  Filled 2017-07-17: qty 2

## 2017-07-17 MED ORDER — FENTANYL CITRATE (PF) 100 MCG/2ML IJ SOLN
50.0000 ug | INTRAMUSCULAR | Status: AC | PRN
  Administered 2017-07-17 (×3): 50 ug via INTRAVENOUS

## 2017-07-17 MED ORDER — SUCCINYLCHOLINE CHLORIDE 200 MG/10ML IV SOSY
PREFILLED_SYRINGE | INTRAVENOUS | Status: AC
  Filled 2017-07-17: qty 10

## 2017-07-17 MED ORDER — LACTATED RINGERS IV SOLN
INTRAVENOUS | Status: DC
  Administered 2017-07-17 (×2): via INTRAVENOUS

## 2017-07-17 MED ORDER — SCOPOLAMINE 1 MG/3DAYS TD PT72
1.0000 | MEDICATED_PATCH | Freq: Once | TRANSDERMAL | Status: DC | PRN
  Administered 2017-07-17: 1.5 mg via TRANSDERMAL

## 2017-07-17 MED ORDER — PHENYLEPHRINE 40 MCG/ML (10ML) SYRINGE FOR IV PUSH (FOR BLOOD PRESSURE SUPPORT)
PREFILLED_SYRINGE | INTRAVENOUS | Status: AC
  Filled 2017-07-17: qty 10

## 2017-07-17 MED ORDER — EPHEDRINE 5 MG/ML INJ
INTRAVENOUS | Status: AC
  Filled 2017-07-17: qty 10

## 2017-07-17 MED ORDER — ACETAMINOPHEN 500 MG PO TABS
1000.0000 mg | ORAL_TABLET | ORAL | Status: AC
  Administered 2017-07-17: 1000 mg via ORAL

## 2017-07-17 MED ORDER — OXYCODONE HCL 5 MG PO TABS
ORAL_TABLET | ORAL | Status: AC
  Filled 2017-07-17: qty 1

## 2017-07-17 MED ORDER — DEXAMETHASONE SODIUM PHOSPHATE 4 MG/ML IJ SOLN
INTRAMUSCULAR | Status: DC | PRN
  Administered 2017-07-17: 10 mg via INTRAVENOUS

## 2017-07-17 MED ORDER — CEFAZOLIN SODIUM-DEXTROSE 2-4 GM/100ML-% IV SOLN
2.0000 g | INTRAVENOUS | Status: AC
  Administered 2017-07-17: 2 g via INTRAVENOUS

## 2017-07-17 MED ORDER — OXYCODONE HCL 5 MG PO TABS
5.0000 mg | ORAL_TABLET | Freq: Once | ORAL | Status: AC
  Administered 2017-07-17: 5 mg via ORAL

## 2017-07-17 MED ORDER — LIDOCAINE 2% (20 MG/ML) 5 ML SYRINGE
INTRAMUSCULAR | Status: AC
  Filled 2017-07-17: qty 5

## 2017-07-17 SURGICAL SUPPLY — 60 items
BAG DECANTER FOR FLEXI CONT (MISCELLANEOUS) ×2 IMPLANT
BINDER BREAST LRG (GAUZE/BANDAGES/DRESSINGS) IMPLANT
BINDER BREAST MEDIUM (GAUZE/BANDAGES/DRESSINGS) ×2 IMPLANT
BINDER BREAST XLRG (GAUZE/BANDAGES/DRESSINGS) IMPLANT
BINDER BREAST XXLRG (GAUZE/BANDAGES/DRESSINGS) IMPLANT
BLADE SURG 10 STRL SS (BLADE) IMPLANT
BLADE SURG 15 STRL LF DISP TIS (BLADE) ×1 IMPLANT
BLADE SURG 15 STRL SS (BLADE) ×1
BNDG GAUZE ELAST 4 BULKY (GAUZE/BANDAGES/DRESSINGS) IMPLANT
CANISTER SUCT 1200ML W/VALVE (MISCELLANEOUS) ×2 IMPLANT
CHLORAPREP W/TINT 26ML (MISCELLANEOUS) ×2 IMPLANT
COVER BACK TABLE 60X90IN (DRAPES) ×2 IMPLANT
COVER MAYO STAND STRL (DRAPES) ×2 IMPLANT
DECANTER SPIKE VIAL GLASS SM (MISCELLANEOUS) IMPLANT
DERMABOND ADVANCED (GAUZE/BANDAGES/DRESSINGS) ×1
DERMABOND ADVANCED .7 DNX12 (GAUZE/BANDAGES/DRESSINGS) ×1 IMPLANT
DRAIN CHANNEL 15F RND FF W/TCR (WOUND CARE) ×2 IMPLANT
DRAPE TOP ARMCOVERS (MISCELLANEOUS) ×2 IMPLANT
DRAPE U-SHAPE 76X120 STRL (DRAPES) ×2 IMPLANT
DRAPE UTILITY XL STRL (DRAPES) ×2 IMPLANT
DRSG PAD ABDOMINAL 8X10 ST (GAUZE/BANDAGES/DRESSINGS) ×2 IMPLANT
ELECT BLADE 4.0 EZ CLEAN MEGAD (MISCELLANEOUS) ×2
ELECT COATED BLADE 2.86 ST (ELECTRODE) ×2 IMPLANT
ELECT REM PT RETURN 9FT ADLT (ELECTROSURGICAL) ×2
ELECTRODE BLDE 4.0 EZ CLN MEGD (MISCELLANEOUS) ×1 IMPLANT
ELECTRODE REM PT RTRN 9FT ADLT (ELECTROSURGICAL) ×1 IMPLANT
EVACUATOR SILICONE 100CC (DRAIN) ×2 IMPLANT
EXPANDER BREAST MV 250CC (Breast) ×2 IMPLANT
GLOVE BIO SURGEON STRL SZ 6 (GLOVE) ×4 IMPLANT
GOWN STRL REUS W/ TWL LRG LVL3 (GOWN DISPOSABLE) ×2 IMPLANT
GOWN STRL REUS W/TWL LRG LVL3 (GOWN DISPOSABLE) ×2
IV NS 500ML (IV SOLUTION) ×1
IV NS 500ML BAXH (IV SOLUTION) ×1 IMPLANT
KIT FILL SYSTEM UNIVERSAL (SET/KITS/TRAYS/PACK) IMPLANT
MARKER SKIN DUAL TIP RULER LAB (MISCELLANEOUS) IMPLANT
PACK BASIN DAY SURGERY FS (CUSTOM PROCEDURE TRAY) ×2 IMPLANT
PENCIL BUTTON HOLSTER BLD 10FT (ELECTRODE) ×2 IMPLANT
PIN SAFETY STERILE (MISCELLANEOUS) ×2 IMPLANT
SHEET MEDIUM DRAPE 40X70 STRL (DRAPES) ×2 IMPLANT
SLEEVE SCD COMPRESS KNEE MED (MISCELLANEOUS) ×2 IMPLANT
SPONGE LAP 18X18 X RAY DECT (DISPOSABLE) ×2 IMPLANT
STAPLER VISISTAT 35W (STAPLE) ×2 IMPLANT
STRIP CLOSURE SKIN 1/2X4 (GAUZE/BANDAGES/DRESSINGS) ×2 IMPLANT
SUT CHROMIC 4 0 PS 2 18 (SUTURE) ×2 IMPLANT
SUT ETHILON 2 0 FS 18 (SUTURE) ×2 IMPLANT
SUT ETHILON 4 0 PS 2 18 (SUTURE) ×2 IMPLANT
SUT MNCRL AB 4-0 PS2 18 (SUTURE) ×2 IMPLANT
SUT PDS AB 2-0 CT2 27 (SUTURE) IMPLANT
SUT SILK 2 0 SH (SUTURE) IMPLANT
SUT VIC AB 3-0 PS1 18 (SUTURE)
SUT VIC AB 3-0 PS1 18XBRD (SUTURE) IMPLANT
SUT VIC AB 3-0 SH 27 (SUTURE) ×1
SUT VIC AB 3-0 SH 27X BRD (SUTURE) ×1 IMPLANT
SUT VICRYL 4-0 PS2 18IN ABS (SUTURE) IMPLANT
SYR BULB IRRIGATION 50ML (SYRINGE) ×4 IMPLANT
SYR CONTROL 10ML LL (SYRINGE) IMPLANT
TOWEL OR 17X24 6PK STRL BLUE (TOWEL DISPOSABLE) ×4 IMPLANT
TUBE CONNECTING 20X1/4 (TUBING) ×4 IMPLANT
UNDERPAD 30X30 (UNDERPADS AND DIAPERS) ×2 IMPLANT
YANKAUER SUCT BULB TIP NO VENT (SUCTIONS) ×2 IMPLANT

## 2017-07-17 NOTE — Transfer of Care (Signed)
Immediate Anesthesia Transfer of Care Note  Patient: Meagan [name]  Procedure(s) Performed: REMOVAL OF TISSUE EXPANDER AND PLACEMENT OF IMPLANT (Left Chest)  Patient Location: PACU  Anesthesia Type:General  Level of Consciousness: awake, alert  and oriented  Airway & Oxygen Therapy: Patient Spontanous Breathing and Patient connected to face mask oxygen  Post-op Assessment: Report given to RN and Post -op Vital signs reviewed and stable  Post vital signs: Reviewed and stable  Last Vitals:  Vitals:   07/17/17 0834  BP: 125/67  Pulse: 70  Resp: 20  Temp: 36.8 C  SpO2: 100%    Last Pain:  Vitals:   07/17/17 0834  TempSrc: Oral         Complications: No apparent anesthesia complications

## 2017-07-17 NOTE — Discharge Instructions (Signed)

## 2017-07-17 NOTE — Op Note (Signed)
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 Rupture of left tissue expander  POSTOPERATIVE DIAGNOSES:  same  PROCEDURE:  Removal and replacement left chest tissue expander  SURGEON: Irene Limbo MD MBA  ASSISTANT: none  ANESTHESIA:  General.   EBL: 20 ml  COMPLICATIONS: None immediate.   INDICATIONS FOR PROCEDURE:  The patient, Meagan [name], is a 41 y.o. female born on 1975/11/21, is here for replacement left tissue expander following deflation. Patient is 5 weeks post left nipple sparing mastectomy with immediate expander acellular dermis reconstruction.   FINDINGS: [place] [id number]-[id number] 250 ml tissue expander placed, initial fill volume 120 ml. SN [id number] Fully incorporated ADM noted. Small pinhole sized hole noted over lower pole explanted expander.Partial dehiscence of left nipple incision noted and this was repaired. Patient is 1.5 weeks post left nipple resection for margins.  DESCRIPTION OF PROCEDURE:  The patient's operative site was marked with the patient in the preoperative area. The patient was taken to the operating room. SCDs were placed and IV antibiotics were given. The patient's operative site was prepped and draped in a sterile fashion. A time out was performed and all information was confirmed to be correct. Incision made in left inframammary fold scar and carried to implant capsule. Expander removed. Examination cavity with well incorporated ADM. Cavity irrigated with saline solution containing Ancef, gentamicin, and bacitracin. 15 FR JP placed and secured with 2-0 nylon. Cavity irrigated with Betadine. Implant placed in cavity. Implant capsule and superficial fascia approximated with running 3-0 monocryl. Dermis closed with 4-0 monocryl and skin closure completed with short running 4-0 nylon. Areola incision  closed with 4-0 monocryl in dermis and 4-0 chromic for skin closure. Dermabond applied to all incisions. Steri strip applied over nylon sutures. Dry dressing and breast binder applied.   The patient was allowed to wake from anesthesia, extubated and taken to the recovery room in satisfactory condition.   SPECIMENS: none  DRAINS: 15 Fr JP in left breast reconstruction  Irene Limbo, MD [facility] [phone], pin [phone]

## 2017-07-17 NOTE — Anesthesia Preprocedure Evaluation (Addendum)
Anesthesia Evaluation  Patient identified by MRN, date of birth, ID band Patient awake    Reviewed: Allergy & Precautions, NPO status , Patient's Chart, lab work & pertinent test results  Airway Mallampati: I  TM Distance: >3 FB Neck ROM: Full    Dental  (+) Teeth Intact, Dental Advisory Given   Pulmonary asthma ,    breath sounds clear to auscultation       Cardiovascular negative cardio ROS   Rhythm:Regular Rate:Normal     Neuro/Psych negative neurological ROS     GI/Hepatic negative GI ROS, Neg liver ROS,   Endo/Other  negative endocrine ROS  Renal/GU negative Renal ROS     Musculoskeletal negative musculoskeletal ROS (+)   Abdominal   Peds  Hematology negative hematology ROS (+)   Anesthesia Other Findings Day of surgery medications reviewed with the patient.  Reproductive/Obstetrics                             Anesthesia Physical  Anesthesia Plan  ASA: II  Anesthesia Plan: General   Post-op Pain Management:    Induction: Intravenous  PONV Risk Score and Plan: 1 and Ondansetron and Dexamethasone  Airway Management Planned: LMA  Additional Equipment:   Intra-op Plan:   Post-operative Plan: Extubation in OR  Informed Consent: I have reviewed the patients History and Physical, chart, labs and discussed the procedure including the risks, benefits and alternatives for the proposed anesthesia with the patient or authorized representative who has indicated his/her understanding and acceptance.   Dental advisory given  Plan Discussed with: CRNA  Anesthesia Plan Comments: (LMA 3 in past)      Anesthesia Quick Evaluation

## 2017-07-17 NOTE — Anesthesia Postprocedure Evaluation (Signed)
Anesthesia Post Note  Patient: Meagan [name]  Procedure(s) Performed: REMOVAL OF TISSUE EXPANDER AND PLACEMENT OF IMPLANT (Left Chest)     Patient location during evaluation: PACU Anesthesia Type: General Level of consciousness: sedated Pain management: pain level controlled Vital Signs Assessment: post-procedure vital signs reviewed and stable Respiratory status: spontaneous breathing and respiratory function stable Cardiovascular status: stable Postop Assessment: no apparent nausea or vomiting Anesthetic complications: no    Last Vitals:  Vitals:   07/17/17 1115 07/17/17 1210  BP: 120/70   Pulse: 65   Resp: 16   Temp: 36.7 C 36.5 C  SpO2: 100%     Last Pain:  Vitals:   07/17/17 1100  TempSrc:   PainSc: 4                  Darryl Blumenstein DANIEL

## 2017-07-17 NOTE — Anesthesia Procedure Notes (Signed)
Procedure Name: LMA Insertion Date/Time: 07/17/2017 9:01 AM Performed by: Melynda Ripple D Pre-anesthesia Checklist: Patient identified, Emergency Drugs available, Suction available and Patient being monitored Patient Re-evaluated:Patient Re-evaluated prior to induction Oxygen Delivery Method: Circle system utilized Preoxygenation: Pre-oxygenation with 100% oxygen Induction Type: IV induction Ventilation: Mask ventilation without difficulty LMA: LMA inserted LMA Size: 3.0 Grade View: Grade I Number of attempts: 1 Airway Equipment and Method: Bite block Placement Confirmation: positive ETCO2 Tube secured with: Tape Dental Injury: Teeth and Oropharynx as per pre-operative assessment

## 2017-07-17 NOTE — Interval H&P Note (Signed)
History and Physical Interval Note:  07/17/2017 8:27 AM  Meagan [name]  has presented today for surgery, with the diagnosis of DCIS LEFT BREAST  The various methods of treatment have been discussed with the patient and family. After consideration of risks, benefits and other options for treatment, the patient has consented to  Removal and replacement left chest tissue expander as a surgical intervention .  The patient's history has been reviewed, patient examined, no change in status, stable for surgery.  I have reviewed the patient's chart and labs.  Questions were answered to the patient's satisfaction.     Drelyn Pistilli

## 2017-07-18 ENCOUNTER — Encounter: Payer: Self-pay | Admitting: General Practice

## 2017-07-18 ENCOUNTER — Encounter (HOSPITAL_BASED_OUTPATIENT_CLINIC_OR_DEPARTMENT_OTHER): Payer: Self-pay | Admitting: Plastic Surgery

## 2017-07-18 NOTE — Progress Notes (Signed)
[facility] Spiritual Care Note  Mailing [place] a [facility] flyer, programming information, and [employer] retreat flyer to encourage exploration/participation.   [place], [place], [facility] Pager [phone] Voicemail [phone]

## 2017-07-20 ENCOUNTER — Encounter: Payer: Managed Care, Other (non HMO) | Admitting: Physical Therapy

## 2017-07-23 ENCOUNTER — Encounter (HOSPITAL_BASED_OUTPATIENT_CLINIC_OR_DEPARTMENT_OTHER): Payer: Self-pay | Admitting: Plastic Surgery

## 2017-07-25 ENCOUNTER — Ambulatory Visit: Payer: Managed Care, Other (non HMO) | Admitting: Physical Therapy

## 2017-07-30 ENCOUNTER — Encounter: Payer: Managed Care, Other (non HMO) | Admitting: Physical Therapy

## 2017-08-01 ENCOUNTER — Encounter: Payer: Managed Care, Other (non HMO) | Admitting: Physical Therapy

## 2017-08-07 ENCOUNTER — Ambulatory Visit: Payer: Managed Care, Other (non HMO) | Attending: Plastic Surgery | Admitting: Physical Therapy

## 2017-08-07 DIAGNOSIS — M6281 Muscle weakness (generalized): Secondary | ICD-10-CM | POA: Diagnosis present

## 2017-08-07 DIAGNOSIS — Z483 Aftercare following surgery for neoplasm: Secondary | ICD-10-CM | POA: Diagnosis not present

## 2017-08-07 DIAGNOSIS — M25512 Pain in left shoulder: Secondary | ICD-10-CM | POA: Insufficient documentation

## 2017-08-07 DIAGNOSIS — M25612 Stiffness of left shoulder, not elsewhere classified: Secondary | ICD-10-CM | POA: Diagnosis present

## 2017-08-07 NOTE — Therapy (Signed)
[place], [place], [address] Phone: [phone]   Fax:  [phone]  Physical Therapy Treatment  Patient Details  Name: Meagan [name] MRN: [id number] Date of Birth: Jul 08, 1976 Referring Provider: Thimmappa    Encounter Date: 08/07/2017  PT End of Session - 08/07/17 1705    Visit Number  3    Number of Visits  11    Date for PT Re-Evaluation  09/13/17    PT Start Time  1520    PT Stop Time  1559    PT Time Calculation (min)  39 min    Activity Tolerance  Patient tolerated treatment well    Behavior During Therapy  WFL for tasks assessed/performed       Past Medical History:  Diagnosis Date  . Asthma    childhood astha, no problems now  . Cancer ([place]) 05/2017   left breast DCIS  . Family history of breast cancer   . Family history of colon cancer   . Family history of melanoma     Past Surgical History:  Procedure Laterality Date  . APPENDECTOMY  10/02/2001  . WISDOM TOOTH EXTRACTION      There were no vitals filed for this visit.  Subjective Assessment - 08/07/17 1521    Subjective  "About four days after the second surgery the tissue expander ruptured.  I had to have a third surgery to replace the tissue expander.  Last Monday Dr. Iran Planas said I could start back." Things are better since then.  I don't have much pain. No restrictions from plastic surgeon except not to overdo activity.    Pertinent History  Pt with Nipple Sparing Mastectomy with no lymph nodes removed 06/12/2017. (Dr. Ninfa Linden)  Immediate placeement of expander (Dr. Iran Planas) and has 2 fills so far, Surgery for nipple resection  on Oct. 5. Pt will not have to have  chemo or radiation. Third surgery for replacement of expander was 07/17/17.    Currently in Pain?  Yes    Pain Score  2     Pain Location  Chest    Pain Orientation  Left;Lower where incision is   where incision is   Aggravating Factors   moving arm (reaching  up)    Pain Relieving Factors  tylenol         [facility] PT Assessment - 08/07/17 0001      Prior Function   Leisure  Pilates, strength and interval training. She danced for 20 years.      Observation/Other Assessments   Skin Integrity  incision is healing well; drainsite still scabbed      AROM   Left Shoulder Flexion  130 Degrees in sitting   in sitting   Left Shoulder ABduction  98 Degrees painful   painful   Left Shoulder External Rotation  39 Degrees      PROM   PROM Assessment Site  Shoulder    Right/Left Shoulder  Left    Left Shoulder Flexion  121 Degrees    Left Shoulder ABduction  78 Degrees pulling in pect   pulling in pect                 [facility] Adult PT Treatment/Exercise - 08/07/17 0001      Manual Therapy   Manual Therapy  Myofascial release    Myofascial Release  left UE myofascial pulling    Passive ROM  in supine to left shoulder with movement into er, abduction, and  flexion as tolerated                     Long Term Clinic Goals - 08/07/17 1709      CC Long Term Goal  #1   Title  Pt will reduce Quick DASH Score to < 10 demonstrating improved function of left arm     Status  On-going      CC Long Term Goal  #2   Title  Pt will increase left grep strength to an average of 45 indicating and improvment of left UE stretngth     Status  On-going      CC Long Term Goal  #3   Title  Pt will be independent in a home exercise program for strength and ROM of left arm     Status  On-going      CC Long Term Goal  #4   Title  left shoulder active flexion to at least 160 degrees    Time  4    Period  Weeks    Status  New      CC Long Term Goal  #5   Title  left shoulder active abduction to at least 160 degrees    Time  4    Period  Weeks    Status  New      CC Long Term Goal  #6   Title  left shoulder external rotation to at least 85 degrees    Time  4    Period  Weeks    Status  New      Additional Goals   Additional  Goals  Yes         Plan - 08/07/17 1706    Clinical Impression Statement  Pt. returns today having been away for several weeks because she needed a third surgery to have her expander removed and replaced.  She has lost some ROM of the left shoulder with that having happened.  She is anxious to see her ROM improve and to be able to be active again.  This is a re-eval today.    Rehab Potential  Good    Clinical Impairments Affecting Rehab Potential  ongoing expander fills     PT Frequency  2x / week    PT Duration  4 weeks    PT Treatment/Interventions  ADLs/Self Care Home Management;Patient/family education;Functional mobility training;Therapeutic activities;Manual techniques;Therapeutic exercise;Neuromuscular re-education;Passive range of motion    PT Next Visit Plan  try pulleys, wall stretches and ball up the wall, supine pec stretches over folded towel or pillows, sidelying ROM, passive ROM and stretching      PT Home Exercise Plan  continue focus on stretching, such as with the dowel    Consulted and Agree with Plan of Care  Patient       Patient will benefit from skilled therapeutic intervention in order to improve the following deficits and impairments:  Decreased endurance, Decreased knowledge of precautions, Decreased activity tolerance, Decreased knowledge of use of DME, Decreased strength, Impaired UE functional use, Pain, Increased muscle spasms, Decreased range of motion  Visit Diagnosis: Aftercare following surgery for neoplasm - Plan: PT plan of care cert/re-cert  Stiffness of left shoulder joint - Plan: PT plan of care cert/re-cert  Acute pain of left shoulder - Plan: PT plan of care cert/re-cert  Muscle weakness (generalized) - Plan: PT plan of care cert/re-cert     Problem List Patient Active Problem List   Diagnosis Date  Noted  . Breast cancer, left breast ([place]) 06/12/2017  . Genetic testing 04/26/2017  . Family history of breast cancer   . Family history of  melanoma   . Family history of colon cancer   . 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 Carcinoma of upper-outer quadrant of left female breast ([place]) 04/13/2017    Zalen Sequeira 08/07/2017, 5:12 PM  [place], [place], [address] Phone: [phone]   Fax:  [phone]  Name: Meagan [name] MRN: [id number] Date of Birth: 04/30/1976  Serafina Royals, PT 08/07/17 5:12 PM

## 2017-08-09 ENCOUNTER — Other Ambulatory Visit: Payer: Self-pay

## 2017-08-09 ENCOUNTER — Encounter: Payer: Self-pay | Admitting: Physical Therapy

## 2017-08-09 ENCOUNTER — Ambulatory Visit: Payer: Managed Care, Other (non HMO) | Admitting: Physical Therapy

## 2017-08-09 DIAGNOSIS — M25612 Stiffness of left shoulder, not elsewhere classified: Secondary | ICD-10-CM

## 2017-08-09 DIAGNOSIS — M25512 Pain in left shoulder: Secondary | ICD-10-CM

## 2017-08-09 DIAGNOSIS — Z483 Aftercare following surgery for neoplasm: Secondary | ICD-10-CM | POA: Diagnosis not present

## 2017-08-09 DIAGNOSIS — M6281 Muscle weakness (generalized): Secondary | ICD-10-CM

## 2017-08-09 NOTE — Therapy (Signed)
[place], [place], [address] Phone: [phone]   Fax:  [phone]  Physical Therapy Treatment  Patient Details  Name: Meagan [name] MRN: [id number] Date of Birth: 08/01/1976 Referring Provider: Thimmappa    Encounter Date: 08/09/2017  PT End of Session - 08/09/17 1740    Visit Number  4    Number of Visits  11    PT Start Time  1751    PT Stop Time  1430    PT Time Calculation (min)  45 min    Activity Tolerance  Patient tolerated treatment well    Behavior During Therapy  WFL for tasks assessed/performed       Past Medical History:  Diagnosis Date  . Asthma    childhood astha, no problems now  . Cancer ([place]) 05/2017   left breast DCIS  . Family history of breast cancer   . Family history of colon cancer   . Family history of melanoma     Past Surgical History:  Procedure Laterality Date  . APPENDECTOMY  10/02/2001  . WISDOM TOOTH EXTRACTION      There were no vitals filed for this visit.  Subjective Assessment - 08/09/17 1353    Subjective  Pt said she had a fill yesterday, she is having increased pain in anterior chest at pec major tendon     Pertinent History  Pt with Nipple Sparing Mastectomy with no lymph nodes removed 06/12/2017. (Dr. Ninfa Linden)  Immediate placeement of expander (Dr. Iran Planas) and has 2 fills so far, Surgery for nipple resection  on Oct. 5. Pt will not have to have  chemo or radiation. Third surgery for replacement of expander was 07/17/17.    Patient Stated Goals  to get range of motion back learn more about exercise progression as pt wants to return to previous exerise     Currently in Pain?  Yes    Pain Score  2  more like a limitation     Pain Location  Chest    Pain Orientation  Left                      [facility] Adult PT Treatment/Exercise - 08/09/17 0001      Shoulder Exercises: Supine   Other Supine Exercises  lying supine over folded towel        Shoulder Exercises: Sidelying   ABduction  AROM;Left;10 reps pt only able to achieve 90 degrees of abduction due to pain     Other Sidelying Exercises  small circles with hand pointed to the ceiling.       Shoulder Exercises: Standing   Other Standing Exercises  UE ranger forward and tout to side/elevation       Shoulder Exercises: Pulleys   Flexion  2 minutes    ABduction  1 minute      Shoulder Exercises: Therapy Ball   Flexion  5 reps    ABduction  5 reps      Shoulder Exercises: Stretch   Corner Stretch  1 rep    Wall Stretch - ABduction  1 rep      Manual Therapy   Manual Therapy  Myofascial release    Myofascial Release  to left pec major tendon     Passive ROM  in supine to left shoulder with movement into er, abduction, and flexion as tolerated  [facility] Goals - 08/07/17 1709      CC Long Term Goal  #1   Title  Pt will reduce Quick DASH Score to < 10 demonstrating improved function of left arm     Status  On-going      CC Long Term Goal  #2   Title  Pt will increase left grep strength to an average of 45 indicating and improvment of left UE stretngth     Status  On-going      CC Long Term Goal  #3   Title  Pt will be independent in a home exercise program for strength and ROM of left arm     Status  On-going      CC Long Term Goal  #4   Title  left shoulder active flexion to at least 160 degrees    Time  4    Period  Weeks    Status  New      CC Long Term Goal  #5   Title  left shoulder active abduction to at least 160 degrees    Time  4    Period  Weeks    Status  New      CC Long Term Goal  #6   Title  left shoulder external rotation to at least 85 degrees    Time  4    Period  Weeks    Status  New      Additional Goals   Additional Goals  Yes         Plan - 08/09/17 1740    Clinical Impression Statement  Pt had pec tightness and pain today after a fill yesterday.  She was able to do stretches  today within her pain limits. Expect she will be able to improve.  Pt thinks implant surgery may be in January .She wants to return to more exercise and may begin with bike, etc. and eventually return to Pilates     Rehab Potential  Good    Clinical Impairments Affecting Rehab Potential  ongoing expander fills     PT Next Visit Plan  cont pulleys, UE ranger wall stretches and ball up the wall, supine pec stretches over folded towel or pillows, sidelying ROM, passive ROM and stretching         Patient will benefit from skilled therapeutic intervention in order to improve the following deficits and impairments:  Decreased endurance, Decreased knowledge of precautions, Decreased activity tolerance, Decreased knowledge of use of DME, Decreased strength, Impaired UE functional use, Pain, Increased muscle spasms, Decreased range of motion  Visit Diagnosis: Aftercare following surgery for neoplasm  Stiffness of left shoulder joint  Acute pain of left shoulder  Muscle weakness (generalized)     Problem List Patient Active Problem List   Diagnosis Date Noted  . Breast cancer, left breast ([place]) 06/12/2017  . Genetic testing 04/26/2017  . Family history of breast cancer   . Family history of melanoma   . Family history of colon cancer   . 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 Owens Shark PT  Norwood Levo 08/09/2017, 5:47 PM  [place], [place], [address] Phone: [phone]   Fax:  [phone]  Name: Meagan [name] MRN: [id number] Date of Birth: 04/22/1976

## 2017-08-14 ENCOUNTER — Encounter: Payer: Self-pay | Admitting: *Deleted

## 2017-08-14 NOTE — Progress Notes (Signed)
[place] Work  Clinical Social Work met with patient for counseling session x2. Concepts discussed during session included "finding a new normal", loss of control/identifying areas of control, posttraumatic growth, and how to manage anxiety surrounding cancer diagnosis/side effects of surgery.  Mrs. Mcphee completed and reviewed with CSW, concepts of Trauma through "Surviving the [employer] (A workbook for telling the cancer story").    Patient plans to follow up with CSW as needed.  CSW provided patient with information on [employer] program at [facility] once surgeries are completed.  Maryjean Morn, MSW, LCSW, OSW-C Clinical Social Worker [facility] [phone]

## 2017-08-16 ENCOUNTER — Encounter: Payer: Self-pay | Admitting: Physical Therapy

## 2017-08-16 ENCOUNTER — Ambulatory Visit: Payer: Managed Care, Other (non HMO) | Admitting: Physical Therapy

## 2017-08-16 DIAGNOSIS — Z483 Aftercare following surgery for neoplasm: Secondary | ICD-10-CM | POA: Diagnosis not present

## 2017-08-16 DIAGNOSIS — M25612 Stiffness of left shoulder, not elsewhere classified: Secondary | ICD-10-CM

## 2017-08-16 DIAGNOSIS — M25512 Pain in left shoulder: Secondary | ICD-10-CM

## 2017-08-16 DIAGNOSIS — M6281 Muscle weakness (generalized): Secondary | ICD-10-CM

## 2017-08-16 NOTE — Patient Instructions (Signed)

## 2017-08-16 NOTE — Therapy (Signed)
[place], [place], [address] Phone: [phone]   Fax:  [phone]  Physical Therapy Treatment  Patient Details  Name: Meagan [name] MRN: [id number] Date of Birth: [birth year] Referring Provider: Thimmappa    Encounter Date: 08/16/2017  PT End of Session - 08/16/17 1752    Visit Number  5    Number of Visits  11    Date for PT Re-Evaluation  09/13/17    PT Start Time  1430    PT Stop Time  1515    PT Time Calculation (min)  45 min    Activity Tolerance  Patient tolerated treatment well    Behavior During Therapy  [facility] for tasks assessed/performed       Past Medical History:  Diagnosis Date  . Asthma    childhood astha, no problems now  . 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 Family history of breast cancer   . Family history of colon cancer   . Family history of melanoma     Past Surgical History:  Procedure Laterality Date  . APPENDECTOMY  10/02/2001  . BREAST RECONSTRUCTION WITH PLACEMENT OF TISSUE EXPANDER AND FLEX HD (ACELLULAR HYDRATED DERMIS) Left 06/12/2017   Procedure: LEFT BREAST RECONSTRUCTION WITH PLACEMENT OF TISSUE EXPANDERAND ALLODERM;  Surgeon: Irene Limbo, MD;  Location: [place];  Service: Plastics;  Laterality: Left;  . EXCISION OF BREAST LESION Left 07/06/2017   Procedure: EXCISION OF LEFT BREAST NIPPLE;  Surgeon: Coralie Keens, MD;  Location: [place];  Service: General;  Laterality: Left;  . NIPPLE SPARING MASTECTOMY Left 06/12/2017   Procedure: LEFT BREAST NIPPLE SPARING MASTECTOMY;  Surgeon: Coralie Keens, MD;  Location: [place];  Service: General;  Laterality: Left;  . REMOVAL OF TISSUE EXPANDER AND PLACEMENT OF IMPLANT Left 07/17/2017   Procedure: REMOVAL OF TISSUE EXPANDER AND PLACEMENT OF IMPLANT;  Surgeon: Irene Limbo, MD;  Location: [place];  Service: Plastics;  Laterality:  Left;  . WISDOM TOOTH EXTRACTION      There were no vitals filed for this visit.  Subjective Assessment - 08/16/17 1445    Subjective  Pt had a plane trip to [place] and had no problem     Pertinent History  Pt with Nipple Sparing Mastectomy with no lymph nodes removed 06/12/2017. (Dr. Ninfa Linden)  Immediate placeement of expander (Dr. Iran Planas) and has 2 fills so far, Surgery for nipple resection  on Oct. 5. Pt will not have to have  chemo or radiation. Third surgery for replacement of expander was 07/17/17.    Patient Stated Goals  to get range of motion back learn more about exercise progression as pt wants to return to previous exerise     Currently in Pain?  No/denies                      [facility] Adult PT Treatment/Exercise - 08/16/17 0001      Shoulder Exercises: Supine   Other Supine Exercises  instructed in supine scaplar series with yellow therabanc also instructed in progression to sit and stand and red band     Other Supine Exercises  lying supine over foam roller towel       Shoulder Exercises: Seated   Other Seated Exercises  UE Ranger in sitting forward and back and in a diagonal with UE ranger on the floor with pt adjusting angles to get maximal stretch  Shoulder Exercises: Pulleys   Flexion  2 minutes      Manual Therapy   Myofascial Release  to left pec major tendon     Passive ROM  in supine to left shoulder with movement into er, abduction, and flexion as tolerated contract relax stretching for increasing external rotation              PT Education - 08/16/17 1752    Education provided  Yes    Education Details  supine scapular series     Person(s) Educated  Patient    Methods  Explanation;Demonstration;Handout    Comprehension  Verbalized understanding;Returned demonstration             [facility] Goals - 08/07/17 1709      CC Long Term Goal  #1   Title  Pt will reduce Quick DASH Score to < 10 demonstrating improved  function of left arm     Status  On-going      CC Long Term Goal  #2   Title  Pt will increase left grep strength to an average of 45 indicating and improvment of left UE stretngth     Status  On-going      CC Long Term Goal  #3   Title  Pt will be independent in a home exercise program for strength and ROM of left arm     Status  On-going      CC Long Term Goal  #4   Title  left shoulder active flexion to at least 160 degrees    Time  4    Period  Weeks    Status  New      CC Long Term Goal  #5   Title  left shoulder active abduction to at least 160 degrees    Time  4    Period  Weeks    Status  New      CC Long Term Goal  #6   Title  left shoulder external rotation to at least 85 degrees    Time  4    Period  Weeks    Status  New      Additional Goals   Additional Goals  Yes         Plan - 08/16/17 1753    Clinical Impression Statement  Upgraded to strengthening with supine scapular series and started with foam roll stretches. Pt is ready to progress to more strengthening activities     PT Next Visit Plan  work on foam roller for stretches and progress strengthening , Manual techniqes for external rotation stretching     Consulted and Agree with Plan of Care  Patient       Patient will benefit from skilled therapeutic intervention in order to improve the following deficits and impairments:     Visit Diagnosis: Aftercare following surgery for neoplasm  Stiffness of left shoulder joint  Acute pain of left shoulder  Muscle weakness (generalized)     Problem List Patient Active Problem List   Diagnosis Date Noted  . Breast cancer, left breast ([place]) 06/12/2017  . Genetic testing 04/26/2017  . Family history of breast cancer   . Family history of melanoma   . Family history of colon cancer   . 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 Carcinoma of upper-outer quadrant of left female breast ([place]) 04/13/2017   Donato Heinz. Owens Shark,  PT  Norwood Levo 08/16/2017, 5:57 PM  [place], [place], [address] Phone: [phone]   Fax:  [phone]  Name: Meagan [name] MRN: [id number] Date of Birth: Mar 30, 1976

## 2017-08-27 ENCOUNTER — Ambulatory Visit: Payer: Managed Care, Other (non HMO) | Admitting: Physical Therapy

## 2017-08-27 DIAGNOSIS — M25512 Pain in left shoulder: Secondary | ICD-10-CM

## 2017-08-27 DIAGNOSIS — M25612 Stiffness of left shoulder, not elsewhere classified: Secondary | ICD-10-CM

## 2017-08-27 DIAGNOSIS — M6281 Muscle weakness (generalized): Secondary | ICD-10-CM

## 2017-08-27 DIAGNOSIS — Z483 Aftercare following surgery for neoplasm: Secondary | ICD-10-CM

## 2017-08-27 NOTE — Therapy (Signed)
[place], [place], [address] Phone: [phone]   Fax:  [phone]  Physical Therapy Treatment  Patient Details  Name: Meagan [name] MRN: [id number] Date of Birth: 04/04/1976 Referring Provider: Thimmappa    Encounter Date: 08/27/2017  PT End of Session - 08/27/17 2029    Visit Number  6    Number of Visits  11    Date for PT Re-Evaluation  09/13/17    PT Start Time  0017    PT Stop Time  1534    PT Time Calculation (min)  60 min    Activity Tolerance  Patient tolerated treatment well    Behavior During Therapy  [facility] for tasks assessed/performed       Past Medical History:  Diagnosis Date  . Asthma    childhood astha, no problems now  . Cancer ([place]) 05/2017   left breast DCIS  . Family history of breast cancer   . Family history of colon cancer   . Family history of melanoma     Past Surgical History:  Procedure Laterality Date  . APPENDECTOMY  10/02/2001  . BREAST RECONSTRUCTION WITH PLACEMENT OF TISSUE EXPANDER AND FLEX HD (ACELLULAR HYDRATED DERMIS) Left 06/12/2017   Procedure: LEFT BREAST RECONSTRUCTION WITH PLACEMENT OF TISSUE EXPANDERAND ALLODERM;  Surgeon: Irene Limbo, MD;  Location: [place];  Service: Plastics;  Laterality: Left;  . EXCISION OF BREAST LESION Left 07/06/2017   Procedure: EXCISION OF LEFT BREAST NIPPLE;  Surgeon: Coralie Keens, MD;  Location: [place];  Service: General;  Laterality: Left;  . NIPPLE SPARING MASTECTOMY Left 06/12/2017   Procedure: LEFT BREAST NIPPLE SPARING MASTECTOMY;  Surgeon: Coralie Keens, MD;  Location: [place];  Service: General;  Laterality: Left;  . REMOVAL OF TISSUE EXPANDER AND PLACEMENT OF IMPLANT Left 07/17/2017   Procedure: REMOVAL OF TISSUE EXPANDER AND PLACEMENT OF IMPLANT;  Surgeon: Irene Limbo, MD;  Location: [place];  Service: Plastics;  Laterality:  Left;  . WISDOM TOOTH EXTRACTION      There were no vitals filed for this visit.  Subjective Assessment - 08/27/17 1438    Subjective  Had a rash develop and Dr. Ninfa Linden treated it with a prednisone prescription, which took care of it.  Went to [place] for Thanksgiving. No problems with the new exercises and has switched from the yellow to the red Theraband.    Currently in Pain?  No/denies         [facility] PT Assessment - 08/27/17 0001      AROM   Left Shoulder Flexion  155 Degrees in sitting    Left Shoulder ABduction  156 Degrees in sitting                  [facility] Adult PT Treatment/Exercise - 08/27/17 1454      Elbow Exercises   Elbow Flexion  Strengthening;Right;Left;Standing;20 reps    Bar Weights/Barbell (Elbow Flexion)  5 lbs could increase next time    Elbow Extension  Strengthening;20 reps;Seated;Both upper arms beside head, straighten elbows toward ceiling    Bar Weights/Barbell (Elbow Extension)  4 lbs in both hands; could do higher weight next time      Lumbar Exercises: Quadruped   Opposite Arm/Leg Raise  Right arm/Left leg;Left arm/Right leg;10 reps;1 second    Plank  30 second hold      Knee/Hip Exercises: Standing   Forward Lunges  -- 3 lbs. each hand  x 24 feet x 2 (easy)    Hip Abduction  Stengthening;Right;Left;20 reps;2 sets;Knee straight red Theraband    Other Standing Knee Exercises  sidestep with squat, red Theraband around ankles x 24 feet each way      Shoulder Exercises: Supine   Other Supine Exercises  pect flies with 4 lbs. weight each hand x 10 + 5      Shoulder Exercises: Standing   Row  Strengthening;Both;Weights;20 reps    Row Weight (lbs)  5 each hand    Other Standing Exercises  bent over reverse flies, 3 lbs. x 20 reps could increase    Other Standing Exercises  3-way arms raises with 3 lbs. each hand x 10 x 2 each way      Ankle Exercises: Standing   Heel Raises  1 second 30 reps, holding 5 lbs. each hand                      [facility] Goals - 08/27/17 2034      CC Long Term Goal  #1   Title  Pt will reduce Quick DASH Score to < 10 demonstrating improved function of left arm     Status  On-going      CC Long Term Goal  #2   Title  Pt will increase left grep strength to an average of 45 indicating an improvment of left UE stretngth     Status  On-going      CC Long Term Goal  #3   Title  Pt will be independent in a home exercise program for strength and ROM of left arm     Status  On-going      CC Long Term Goal  #4   Title  left shoulder active flexion to at least 160 degrees    Baseline  155 as of 08/27/17    Status  Partially Met      CC Long Term Goal  #5   Title  left shoulder active abduction to at least 160 degrees    Baseline  156 as of 08/27/17    Time  4    Period  Weeks    Status  Partially Met      CC Long Term Goal  #6   Title  left shoulder external rotation to at least 85 degrees    Status  On-going         Plan - 08/27/17 2030    Clinical Impression Statement  Continued to progress the strengthening aspect of her exercise program today. She did well with new exercises, and, as notes indicate, she would be able to increase resistance and/or repetitions with a number of these.  She did, though, feel that she got a workout today, starting her on a path to regaining strength that she has lost.  At end of session, pt. requested stretching, which was provided by Leone Payor, PT, as the primary therapist had another patient waiting. Pt. did show good improvement in left shoulder A/ROM today.    Rehab Potential  Good    Clinical Impairments Affecting Rehab Potential  ongoing expander fills     PT Duration  4 weeks    PT Treatment/Interventions  ADLs/Self Care Home Management;Patient/family education;Functional mobility training;Therapeutic activities;Manual techniques;Therapeutic exercise;Neuromuscular re-education;Passive range of motion    PT Next  Visit Plan  Continue to progress strengthening per suggestions in this note; work on foam roller for stretches., Manual techniqes for external  rotation stretching     Consulted and Agree with Plan of Care  Patient       Patient will benefit from skilled therapeutic intervention in order to improve the following deficits and impairments:  Decreased endurance, Decreased knowledge of precautions, Decreased activity tolerance, Decreased knowledge of use of DME, Decreased strength, Impaired UE functional use, Pain, Increased muscle spasms, Decreased range of motion  Visit Diagnosis: Aftercare following surgery for neoplasm  Stiffness of left shoulder joint  Acute pain of left shoulder  Muscle weakness (generalized)     Problem List Patient Active Problem List   Diagnosis Date Noted  . Breast cancer, left breast ([place]) 06/12/2017  . Genetic testing 04/26/2017  . Family history of breast cancer   . Family history of melanoma   . Family history of colon cancer   . 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

## 2017-08-29 ENCOUNTER — Encounter: Payer: Self-pay | Admitting: Physical Therapy

## 2017-08-29 ENCOUNTER — Ambulatory Visit: Payer: Managed Care, Other (non HMO) | Admitting: Physical Therapy

## 2017-08-29 DIAGNOSIS — M25512 Pain in left shoulder: Secondary | ICD-10-CM

## 2017-08-29 DIAGNOSIS — Z483 Aftercare following surgery for neoplasm: Secondary | ICD-10-CM

## 2017-08-29 DIAGNOSIS — M6281 Muscle weakness (generalized): Secondary | ICD-10-CM

## 2017-08-29 DIAGNOSIS — M25612 Stiffness of left shoulder, not elsewhere classified: Secondary | ICD-10-CM

## 2017-08-29 NOTE — Therapy (Signed)
[place], [place], [address] Phone: [phone]   Fax:  [phone]  Physical Therapy Treatment  Patient Details  Name: Meagan [name] MRN: [id number] Date of Birth: Jul 31, 1976 Referring Provider: Thimmappa    Encounter Date: 08/29/2017  PT End of Session - 08/29/17 1153    Visit Number  7    Number of Visits  11    Date for PT Re-Evaluation  09/13/17    PT Start Time  1020    PT Stop Time  1105    PT Time Calculation (min)  45 min    Activity Tolerance  Patient tolerated treatment well    Behavior During Therapy  [facility] for tasks assessed/performed       Past Medical History:  Diagnosis Date  . Asthma    childhood astha, no problems now  . Cancer ([place]) 05/2017   left breast DCIS  . Family history of breast cancer   . Family history of colon cancer   . Family history of melanoma     Past Surgical History:  Procedure Laterality Date  . APPENDECTOMY  10/02/2001  . BREAST RECONSTRUCTION WITH PLACEMENT OF TISSUE EXPANDER AND FLEX HD (ACELLULAR HYDRATED DERMIS) Left 06/12/2017   Procedure: LEFT BREAST RECONSTRUCTION WITH PLACEMENT OF TISSUE EXPANDERAND ALLODERM;  Surgeon: Irene Limbo, MD;  Location: [place];  Service: Plastics;  Laterality: Left;  . EXCISION OF BREAST LESION Left 07/06/2017   Procedure: EXCISION OF LEFT BREAST NIPPLE;  Surgeon: Coralie Keens, MD;  Location: [place];  Service: General;  Laterality: Left;  . NIPPLE SPARING MASTECTOMY Left 06/12/2017   Procedure: LEFT BREAST NIPPLE SPARING MASTECTOMY;  Surgeon: Coralie Keens, MD;  Location: [place];  Service: General;  Laterality: Left;  . REMOVAL OF TISSUE EXPANDER AND PLACEMENT OF IMPLANT Left 07/17/2017   Procedure: REMOVAL OF TISSUE EXPANDER AND PLACEMENT OF IMPLANT;  Surgeon: Irene Limbo, MD;  Location: [place];  Service: Plastics;  Laterality:  Left;  . WISDOM TOOTH EXTRACTION      There were no vitals filed for this visit.  Subjective Assessment - 08/29/17 1026    Subjective  Pt says she is not having any soreness from the exercises the other day. she finds the stretching valuable.  She plans to return back her gym ( [facility] [employer] [facility]) after Dec.1     Pertinent History  Pt with left Nipple Sparing Mastectomy with no lymph nodes removed 06/12/2017. (Dr. Ninfa Linden)  Immediate placeement of expander (Dr. Iran Planas) and has 2 fills so far, Surgery for nipple resection  on Oct. 5. Pt will not have to have  chemo or radiation. Third surgery for replacement of expander was 07/17/17.    Patient Stated Goals  to get range of motion back learn more about exercise progression as pt wants to return to previous exerise     Currently in Pain?  No/denies occasion point tender pain in left pec major                       [facility] Adult PT Treatment/Exercise - 08/29/17 0001      Exercises   Exercises  Other Exercises    Other Exercises   issued Strength ABC handout for pt to use as a reference as she is familiar with the exercises.       Shoulder Exercises: Supine   Other Supine Exercises  lying supine over foam  rolller for [employer] and altenating marching, arm raises, arm and leg raises, bridges. bilateral horizontal abduction       Shoulder Exercises: Prone   Retraction  AROM;Both;5 reps    Extension  AROM;Left;Both;5 reps    Other Prone Exercises  upper trunk extension     Other Prone Exercises  in quaduped with left hand behing head for scapular retraction and thoracic rotation.       Shoulder Exercises: Standing   Retraction  Strengthening;Right;Left;10 reps with both hand in loop of yellow theraband       Shoulder Exercises: Pulleys   Flexion  2 minutes    ABduction  2 minutes      Shoulder Exercises: Therapy Ball   Flexion  10 reps    ABduction  5 reps with 2 lb. weight on wrist      Manual Therapy   Passive  ROM  PROM left shoulder in supine focusing on flexion and abduction with external rotation to stretch pec muscle.                     [facility] Goals - 08/27/17 2034      CC Long Term Goal  #1   Title  Pt will reduce Quick DASH Score to < 10 demonstrating improved function of left arm     Status  On-going      CC Long Term Goal  #2   Title  Pt will increase left grep strength to an average of 45 indicating an improvment of left UE stretngth     Status  On-going      CC Long Term Goal  #3   Title  Pt will be independent in a home exercise program for strength and ROM of left arm     Status  On-going      CC Long Term Goal  #4   Title  left shoulder active flexion to at least 160 degrees    Baseline  155 as of 08/27/17    Status  Partially Met      CC Long Term Goal  #5   Title  left shoulder active abduction to at least 160 degrees    Baseline  156 as of 08/27/17    Time  4    Period  Weeks    Status  Partially Met      CC Long Term Goal  #6   Title  left shoulder external rotation to at least 85 degrees    Status  On-going         Plan - 08/29/17 1153    Clinical Impression Statement  Pt continues to progress and increase strengthening and plans to return to her gym next week.  Feel she shoulde be able to return to [place] that she she enjoys with some modifications and she plans to talk to her instructor about that. She receives most benefit from the manual stretching when she comes here. Feel that she would benefit form scapular and thoracic mobilty as well.     Clinical Impairments Affecting Rehab Potential  ongoing expander fills     PT Duration  4 weeks    PT Treatment/Interventions  ADLs/Self Care Home Management;Patient/family education;Functional mobility training;Therapeutic activities;Manual techniques;Therapeutic exercise;Neuromuscular re-education;Passive range of motion    PT Next Visit Plan  Continue to work on foam roller for stretches.,  and progress exercise for scapular and thoracic  mobility and strengthening. Manual techniqes for shoulder stretching with myofascial release  as needed to peck     Consulted and Agree with Plan of Care  Patient       Patient will benefit from skilled therapeutic intervention in order to improve the following deficits and impairments:  Decreased endurance, Decreased knowledge of precautions, Decreased activity tolerance, Decreased knowledge of use of DME, Decreased strength, Impaired UE functional use, Pain, Increased muscle spasms, Decreased range of motion  Visit Diagnosis: Aftercare following surgery for neoplasm  Stiffness of left shoulder joint  Acute pain of left shoulder  Muscle weakness (generalized)     Problem List Patient Active Problem List   Diagnosis Date Noted  . Breast cancer, left breast ([place]) 06/12/2017  . Genetic testing 04/26/2017  . Family history of breast cancer   . Family history of melanoma   . Family history of colon cancer   . 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 Carcinoma of upper-outer quadrant of left female breast ([place]) 04/13/2017   Donato Heinz. Owens Shark PT  Norwood Levo 08/29/2017, 11:58 AM  [place], [place], [address] Phone: [phone]   Fax:  [phone]  Name: Meagan [name] MRN: [id number] Date of Birth: [birth year]

## 2017-09-03 ENCOUNTER — Ambulatory Visit: Payer: Managed Care, Other (non HMO) | Attending: Plastic Surgery | Admitting: Physical Therapy

## 2017-09-03 DIAGNOSIS — M25612 Stiffness of left shoulder, not elsewhere classified: Secondary | ICD-10-CM | POA: Insufficient documentation

## 2017-09-03 DIAGNOSIS — M6281 Muscle weakness (generalized): Secondary | ICD-10-CM | POA: Insufficient documentation

## 2017-09-03 DIAGNOSIS — M25512 Pain in left shoulder: Secondary | ICD-10-CM | POA: Insufficient documentation

## 2017-09-03 DIAGNOSIS — Z483 Aftercare following surgery for neoplasm: Secondary | ICD-10-CM

## 2017-09-03 NOTE — Therapy (Signed)
[place], [place], [address] Phone: [phone]   Fax:  [phone]  Physical Therapy Treatment  Patient Details  Name: Meagan [name] MRN: [id number] Date of Birth: 19-Mar-1976 Referring Provider: Thimmappa    Encounter Date: 09/03/2017  PT End of Session - 09/03/17 1724    Visit Number  8    Number of Visits  11    Date for PT Re-Evaluation  09/13/17    PT Start Time  7078    PT Stop Time  1432    PT Time Calculation (min)  47 min    Activity Tolerance  Patient tolerated treatment well    Behavior During Therapy  [facility] for tasks assessed/performed       Past Medical History:  Diagnosis Date  . Asthma    childhood astha, no problems now  . 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 Family history of breast cancer   . Family history of colon cancer   . Family history of melanoma     Past Surgical History:  Procedure Laterality Date  . APPENDECTOMY  10/02/2001  . BREAST RECONSTRUCTION WITH PLACEMENT OF TISSUE EXPANDER AND FLEX HD (ACELLULAR HYDRATED DERMIS) Left 06/12/2017   Procedure: LEFT BREAST RECONSTRUCTION WITH PLACEMENT OF TISSUE EXPANDERAND ALLODERM;  Surgeon: Irene Limbo, MD;  Location: [place];  Service: Plastics;  Laterality: Left;  . EXCISION OF BREAST LESION Left 07/06/2017   Procedure: EXCISION OF LEFT BREAST NIPPLE;  Surgeon: Coralie Keens, MD;  Location: [place];  Service: General;  Laterality: Left;  . NIPPLE SPARING MASTECTOMY Left 06/12/2017   Procedure: LEFT BREAST NIPPLE SPARING MASTECTOMY;  Surgeon: Coralie Keens, MD;  Location: [place];  Service: General;  Laterality: Left;  . REMOVAL OF TISSUE EXPANDER AND PLACEMENT OF IMPLANT Left 07/17/2017   Procedure: REMOVAL OF TISSUE EXPANDER AND PLACEMENT OF IMPLANT;  Surgeon: Irene Limbo, MD;  Location: [place];  Service: Plastics;  Laterality:  Left;  . WISDOM TOOTH EXTRACTION      There were no vitals filed for this visit.  Subjective Assessment - 09/03/17 1350    Subjective  Doing fine.  Feeling good. I went to Pilates on Saturday. Will go back to Pilates tomorrow and just restarted the gym membership and will work slowly back into things. Next surgery is scheduled for December 31st.    Pertinent History  Pt with left Nipple Sparing Mastectomy with no lymph nodes removed 06/12/2017. (Dr. Ninfa Linden)  Immediate placeement of expander (Dr. Iran Planas) and has 2 fills so far, Surgery for nipple resection  on Oct. 5. Pt will not have to have  chemo or radiation. Third surgery for replacement of expander was 07/17/17.    Currently in Pain?  No/denies         [facility] PT Assessment - 09/03/17 0001      AROM   Left Shoulder Flexion  155 Degrees in sitting    Left Shoulder ABduction  175 Degrees    Left Shoulder External Rotation  65 Degrees      Strength   Left Hand Grip (lbs)  40/41/40           Katina Dung - 09/03/17 0001    Open a tight or new jar  Unable    Do heavy household chores (wash walls, wash floors)  Mild difficulty    Carry a shopping bag or briefcase  No difficulty    Wash  your back  No difficulty    Use a knife to cut food  Mild difficulty    Recreational activities in which you take some force or impact through your arm, shoulder, or hand (golf, hammering, tennis)  Moderate difficulty    During the past week, to what extent has your arm, shoulder or hand problem interfered with your normal social activities with family, friends, neighbors, or groups?  Not at all    During the past week, to what extent has your arm, shoulder or hand problem limited your work or other regular daily activities  Not at all    Arm, shoulder, or hand pain.  None    Tingling (pins and needles) in your arm, shoulder, or hand  None    Difficulty Sleeping  No difficulty    DASH Score  18.18 %            [facility] Adult PT  Treatment/Exercise - 09/03/17 0001      Shoulder Exercises: Supine   Other Supine Exercises  hooklying lower trunk rotation to right with arms outstretched x 60 seconds, done the other way x 30 seconds      Manual Therapy   Myofascial Release  left UE myofascial pulling with movement into abduction; crosshands technique in horizontal across chest; also in diagonal from left anterior shoulder to right abdomen    Passive ROM  to left shoulder in supine with movement into er, abduction, and flexion; also for horizontal abduction with left arm over edge of mat    Neural Stretch  Passive neural stretch to left UE in supine to pt tolerance (mostly at 90 degrees abduction)                     Long Term Clinic Goals - 09/03/17 1353      CC Long Term Goal  #1   Title  Pt will reduce Quick DASH Score to < 10 demonstrating improved function of left arm     Baseline  36.36 at eval; 18.18 on 09/03/17    Status  Partially Met      CC Long Term Goal  #2   Title  Pt will increase left grip strength to an average of 45 indicating an improvment of left UE stretngth     Baseline  33 at eval; 40 on 09/03/17    Status  Partially Met      CC Long Term Goal  #3   Title  Pt will be independent in a home exercise program for strength and ROM of left arm     Status  On-going      CC Long Term Goal  #4   Title  left shoulder active flexion to at least 160 degrees    Baseline  155 as of 08/27/17 and 09/03/17    Status  Partially Met      CC Long Term Goal  #5   Title  left shoulder active abduction to at least 160 degrees    Baseline  156 as of 08/27/17; 175 on 09/03/17    Status  Achieved      CC Long Term Goal  #6   Title  left shoulder external rotation to at least 85 degrees    Baseline  65 on 09/03/17    Status  Partially Met         Plan - 09/03/17 1725    Clinical Impression Statement  Pt. has started back with some Pilates and  will go to the gym, planning to gradually work into  her activity there.  She has met or partially met goals, but hasn't fully met them. She still has discomfort with stretching. She will have implant surgery 10/01/17    Rehab Potential  Good    PT Frequency  2x / week    PT Duration  4 weeks    PT Treatment/Interventions  ADLs/Self Care Home Management;Patient/family education;Functional mobility training;Therapeutic activities;Manual techniques;Therapeutic exercise;Neuromuscular re-education;Passive range of motion    PT Next Visit Plan  Continue to work on foam roller for stretches., and progress exercise for scapular and thoracic  mobility and strengthening. Manual techniqes for shoulder stretching with myofascial release as needed to pec    PT Home Exercise Plan  continue focus on stretching, such as with the dowel    Consulted and Agree with Plan of Care  Patient       Patient will benefit from skilled therapeutic intervention in order to improve the following deficits and impairments:  Decreased endurance, Decreased knowledge of precautions, Decreased activity tolerance, Decreased knowledge of use of DME, Decreased strength, Impaired UE functional use, Pain, Increased muscle spasms, Decreased range of motion  Visit Diagnosis: Aftercare following surgery for neoplasm  Stiffness of left shoulder joint  Acute pain of left shoulder     Problem List Patient Active Problem List   Diagnosis Date Noted  . Breast cancer, left breast ([place]) 06/12/2017  . Genetic testing 04/26/2017  . Family history of breast cancer   . Family history of melanoma   . Family history of colon cancer   . 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 Carcinoma of upper-outer quadrant of left female breast ([place]) 04/13/2017    Ellorie Kindall 09/03/2017, 5:27 PM  [place], [place], [address] Phone: [phone]   Fax:  [phone]  Name: Meagan [name] MRN:  [id number] Date of Birth: August 20, 1976  Serafina Royals, PT 09/03/17 5:27 PM

## 2017-09-05 ENCOUNTER — Ambulatory Visit: Payer: Managed Care, Other (non HMO) | Admitting: Physical Therapy

## 2017-09-05 ENCOUNTER — Encounter: Payer: Self-pay | Admitting: Physical Therapy

## 2017-09-05 ENCOUNTER — Other Ambulatory Visit: Payer: Self-pay

## 2017-09-05 DIAGNOSIS — M6281 Muscle weakness (generalized): Secondary | ICD-10-CM

## 2017-09-05 DIAGNOSIS — Z483 Aftercare following surgery for neoplasm: Secondary | ICD-10-CM

## 2017-09-05 DIAGNOSIS — M25512 Pain in left shoulder: Secondary | ICD-10-CM

## 2017-09-05 DIAGNOSIS — M25612 Stiffness of left shoulder, not elsewhere classified: Secondary | ICD-10-CM

## 2017-09-05 NOTE — Therapy (Signed)
[place], [place], [address] Phone: [phone]   Fax:  [phone]  Physical Therapy Treatment  Patient Details  Name: Meagan [name] MRN: [id number] Date of Birth: [birth year] Referring Provider: Thimmappa    Encounter Date: 09/05/2017  PT End of Session - 09/05/17 1232    Visit Number  9    Number of Visits  11    Date for PT Re-Evaluation  09/13/17    PT Start Time  0930    PT Stop Time  1015    PT Time Calculation (min)  45 min    Activity Tolerance  Patient tolerated treatment well    Behavior During Therapy  [facility] for tasks assessed/performed       Past Medical History:  Diagnosis Date  . Asthma    childhood astha, no problems now  . Cancer ([place]) 05/2017   left breast DCIS  . Family history of breast cancer   . Family history of colon cancer   . Family history of melanoma     Past Surgical History:  Procedure Laterality Date  . APPENDECTOMY  10/02/2001  . BREAST RECONSTRUCTION WITH PLACEMENT OF TISSUE EXPANDER AND FLEX HD (ACELLULAR HYDRATED DERMIS) Left 06/12/2017   Procedure: LEFT BREAST RECONSTRUCTION WITH PLACEMENT OF TISSUE EXPANDERAND ALLODERM;  Surgeon: Irene Limbo, MD;  Location: [place];  Service: Plastics;  Laterality: Left;  . EXCISION OF BREAST LESION Left 07/06/2017   Procedure: EXCISION OF LEFT BREAST NIPPLE;  Surgeon: Coralie Keens, MD;  Location: [place];  Service: General;  Laterality: Left;  . NIPPLE SPARING MASTECTOMY Left 06/12/2017   Procedure: LEFT BREAST NIPPLE SPARING MASTECTOMY;  Surgeon: Coralie Keens, MD;  Location: [place];  Service: General;  Laterality: Left;  . REMOVAL OF TISSUE EXPANDER AND PLACEMENT OF IMPLANT Left 07/17/2017   Procedure: REMOVAL OF TISSUE EXPANDER AND PLACEMENT OF IMPLANT;  Surgeon: Irene Limbo, MD;  Location: [place];  Service: Plastics;  Laterality:  Left;  . WISDOM TOOTH EXTRACTION      There were no vitals filed for this visit.  Subjective Assessment - 09/05/17 0937    Subjective  Pt has increased her exercise and is doing well and is try to exercise about 3 days a week. She still feels tightness across her chest with stretching     Pertinent History  Pt with left Nipple Sparing Mastectomy with no lymph nodes removed 06/12/2017. (Dr. Ninfa Linden)  Immediate placeement of expander (Dr. Iran Planas) and has 2 fills so far, Surgery for nipple resection  on Oct. 5. Pt will not have to have  chemo or radiation. Third surgery for replacement of expander was 07/17/17.    Patient Stated Goals  to get range of motion back learn more about exercise progression as pt wants to return to previous exerise     Currently in Pain?  No/denies                      [facility] Adult PT Treatment/Exercise - 09/05/17 0001      Shoulder Exercises: Supine   Protraction  Strengthening;Left;10 reps;Weights    Protraction Weight (lbs)  3    Other Supine Exercises  holding a 3 # weight for circles and figure 8 with stops and starts       Shoulder Exercises: Standing   Retraction  Strengthening;Left;20 reps 10 resisted scap retraction yellow band and 10 thoracic rota    Other  Standing Exercises  theraband loop for scapular retraction and slide forearms up the wall       Shoulder Exercises: Stretch   Other Shoulder Stretches  with upper back on yellow ball for anterior pec stretch both with hips elevated into bridge for more aggressive stretch and in hooklying with upper back on ball and head on pillow in more comfortable position with myofascial release to this area       Manual Therapy   Myofascial Release  to left pec major and anteior shoulder area     Passive ROM  with pulling to shoulder in end range of flexion, abdution and external rotation                      Long Term Clinic Goals - 09/03/17 1353      CC Long Term Goal  #1    Title  Pt will reduce Quick DASH Score to < 10 demonstrating improved function of left arm     Baseline  36.36 at eval; 18.18 on 09/03/17    Status  Partially Met      CC Long Term Goal  #2   Title  Pt will increase left grip strength to an average of 45 indicating an improvment of left UE stretngth     Baseline  33 at eval; 40 on 09/03/17    Status  Partially Met      CC Long Term Goal  #3   Title  Pt will be independent in a home exercise program for strength and ROM of left arm     Status  On-going      CC Long Term Goal  #4   Title  left shoulder active flexion to at least 160 degrees    Baseline  155 as of 08/27/17 and 09/03/17    Status  Partially Met      CC Long Term Goal  #5   Title  left shoulder active abduction to at least 160 degrees    Baseline  156 as of 08/27/17; 175 on 09/03/17    Status  Achieved      CC Long Term Goal  #6   Title  left shoulder external rotation to at least 85 degrees    Baseline  65 on 09/03/17    Status  Partially Met         Plan - 09/05/17 1232    Clinical Impression Statement  Pt continues to make gradual improvements. Added more strengthening to scapular muscles today and other ways to do stretching for anterior chest and stretch     PT Next Visit Plan  Continue to work on foam roller for stretches., and progress exercise for scapular and thoracic  mobility and strengthening. Manual techniqes for shoulder stretching with myofascial release as needed to pec    Consulted and Agree with Plan of Care  Patient       Patient will benefit from skilled therapeutic intervention in order to improve the following deficits and impairments:  Decreased endurance, Decreased knowledge of precautions, Decreased activity tolerance, Decreased knowledge of use of DME, Decreased strength, Impaired UE functional use, Pain, Increased muscle spasms, Decreased range of motion  Visit Diagnosis: Stiffness of left shoulder joint  Aftercare following surgery for  neoplasm  Acute pain of left shoulder  Muscle weakness (generalized)     Problem List Patient Active Problem List   Diagnosis Date Noted  . Breast cancer, left breast ([place]) 06/12/2017  .  Genetic testing 04/26/2017  . Family history of breast cancer   . Family history of melanoma   . Family history of colon cancer   . 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 Owens Shark PT  Norwood Levo 09/05/2017, 12:36 PM  [place], [place], [address] Phone: [phone]   Fax:  [phone]  Name: Meagan [name] MRN: [id number] Date of Birth: September 07, 1976

## 2017-09-10 ENCOUNTER — Encounter: Payer: Managed Care, Other (non HMO) | Admitting: Physical Therapy

## 2017-09-12 ENCOUNTER — Encounter: Payer: Managed Care, Other (non HMO) | Admitting: Physical Therapy

## 2017-09-13 ENCOUNTER — Encounter: Payer: Self-pay | Admitting: Physical Therapy

## 2017-09-13 ENCOUNTER — Ambulatory Visit: Payer: Managed Care, Other (non HMO) | Admitting: Physical Therapy

## 2017-09-13 DIAGNOSIS — M25512 Pain in left shoulder: Secondary | ICD-10-CM

## 2017-09-13 DIAGNOSIS — Z483 Aftercare following surgery for neoplasm: Secondary | ICD-10-CM

## 2017-09-13 DIAGNOSIS — M6281 Muscle weakness (generalized): Secondary | ICD-10-CM

## 2017-09-13 DIAGNOSIS — M25612 Stiffness of left shoulder, not elsewhere classified: Secondary | ICD-10-CM

## 2017-09-13 NOTE — H&P (Signed)
Subjective:     Patient ID: Meagan [name] is a 41 y.o. female.  HPI  3 months post op NSM with immediate TE/ADM reconstruction.Underwent nipple resection for margins. Experienced deflation TE following this and now 8 weeks post replacement. Plan for implant exchange and right augmentation for symmetry.   Presented following screening MMG with left breast asymmetries. Diagnostic MMG and [place] with two lesions left breast  12 o'clock 1 cmfn measuring 9 x 1 x 5 mm and at 12:30 2 cmfn measuring 5 x 2 x 6 mm. [place] with normal axilla. Biopsy both revealed low-grade DCIS, ER/PR+. 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 MRI witho significant enhancement in the left breast above the background enhancement. Final pathology 2.7 cm low grade DCIS, <0.1 cm of anterior nipple margin multifocally, 0.1 cm of posterior margin focally. No SLN done. Nipple resection with no evidence malignancy. On tamoxifen.  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  Prior < A cup, notes she has to special order bras. Goal size  would be not to have to special order bras. Left mastectomy 94 g  Works from home in [employer].   Review of Systems  Objective:   Physical Exam  Cardiovascular: Normal rate and regular rhythm.   Pulmonary/Chest: Effort normal and breath sounds normal.   Chest: scars maturing soft Right breast no masses soft tissue pinch 3 cm no ptosis SN to nipple R 17 L 16 cm to center areola BW R 11 L 11 cm (CW11) Nipple to IMF R 7 L 6 cm from center areola to scar     Assessment:     DCIS Left breast  S/p Left NSM TE/ADM (Alloderm) reconstruction S/p left nipple excision Deflation left TE s/p replacement    Plan:      Plan removal TE and placement implant, opposite breast augmentation for symmetry. Discussed silicone vs saline, smooth vs textured, shaped vs round. Counseled with regards to size, guided by CW. She is satisfied with left expander size- we reviewed implant catalog and anticipate she will end up with implant  volume 250-350 ml- what this translated to cup size I cannot assure her. Reviewed ALCL association with textured implants, benefit of texturing with regards to rotation and displacement. Discussed rippling, capacity filled or cohesive gel may reduce these risks over saline. Reviewed MRI surveillance silicone implants, risk rupture, capsular contracture, rippling, infection requiring removal. She notes given her breast density she has discussed screening MRI right breast.  Plan smooth round silicone.  Plan right augmentation, dual plane via IMF scar. Reviewed I recommend dual plane position given her paucity of overlying soft tissue.   Discussed nipple sharing for reconstruction left side- this would involve excision right nipple and placement of this as graft over left. Risk failure of graft discussed. She would like to proceed with this.  Rx for oxycodone, Robaxin and Bactrim given. Reviewed post procedure limitations and recovery.  Natrelle 133MV-11 T 250 ml tissue expander,  225 ml total fill volume.   Irene Limbo, MD [facility] [phone], pin [phone]

## 2017-09-13 NOTE — Therapy (Addendum)
[place], [place], [address] Phone: [phone]   Fax:  [phone]  Physical Therapy Treatment  Patient Details  Name: Meagan [name] MRN: [id number] Date of Birth: 11/03/1975 Referring Provider: Thimmappa    Encounter Date: 09/13/2017  PT End of Session - 09/13/17 1653    Visit Number  10    Number of Visits  11    Date for PT Re-Evaluation  09/13/17    PT Start Time  2585    PT Stop Time  1600    PT Time Calculation (min)  45 min    Activity Tolerance  Patient tolerated treatment well    Behavior During Therapy  [facility] for tasks assessed/performed       Past Medical History:  Diagnosis Date  . Asthma    childhood astha, no problems now  . 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 Family history of breast cancer   . Family history of colon cancer   . Family history of melanoma     Past Surgical History:  Procedure Laterality Date  . APPENDECTOMY  10/02/2001  . BREAST RECONSTRUCTION WITH PLACEMENT OF TISSUE EXPANDER AND FLEX HD (ACELLULAR HYDRATED DERMIS) Left 06/12/2017   Procedure: LEFT BREAST RECONSTRUCTION WITH PLACEMENT OF TISSUE EXPANDERAND ALLODERM;  Surgeon: Irene Limbo, MD;  Location: [facility];  Service: Plastics;  Laterality: Left;  . EXCISION OF BREAST LESION Left 07/06/2017   Procedure: EXCISION OF LEFT BREAST NIPPLE;  Surgeon: Coralie Keens, MD;  Location: [facility];  Service: General;  Laterality: Left;  . NIPPLE SPARING MASTECTOMY Left 06/12/2017   Procedure: LEFT BREAST NIPPLE SPARING MASTECTOMY;  Surgeon: Coralie Keens, MD;  Location: [facility];  Service: General;  Laterality: Left;  . REMOVAL OF TISSUE EXPANDER AND PLACEMENT OF IMPLANT Left 07/17/2017   Procedure: REMOVAL OF TISSUE EXPANDER AND PLACEMENT OF IMPLANT;  Surgeon: Irene Limbo, MD;  Location: [facility];  Service: Plastics;  Laterality:  Left;  . WISDOM TOOTH EXTRACTION      There were no vitals filed for this visit.  Subjective Assessment - 09/13/17 1526    Subjective  pt took the redeye back from [place] this morning so she is tired . Arm feels a lttle tight but feels.     Pertinent History  Pt with left Nipple Sparing Mastectomy with no lymph nodes removed 06/12/2017. (Dr. Ninfa Linden)  Immediate placeement of expander (Dr. Iran Planas) and has 2 fills so far, Surgery for nipple resection  on Oct. 5. Pt will not have to have  chemo or radiation. Third surgery for replacement of expander was 07/17/17.    Patient Stated Goals  to get range of motion back learn more about exercise progression as pt wants to return to previous exerise     Currently in Pain?  No/denies         [facility] PT Assessment - 09/13/17 0001      Observation/Other Assessments   Quick DASH   15.91      AROM   Left Shoulder Flexion  164 Degrees    Left Shoulder ABduction  180 Degrees    Left Shoulder External Rotation  85 Degrees      Strength   Left Hand Grip (lbs)  --           Katina Dung - 09/13/17 0001    Open a tight or new jar  Moderate difficulty    Do heavy  household chores (wash walls, wash floors)  Moderate difficulty    Carry a shopping bag or briefcase  No difficulty    Wash your back  Mild difficulty    Use a knife to cut food  No difficulty    Recreational activities in which you take some force or impact through your arm, shoulder, or hand (golf, hammering, tennis)  Mild difficulty    During the past week, to what extent has your arm, shoulder or hand problem interfered with your normal social activities with family, friends, neighbors, or groups?  Not at all    During the past week, to what extent has your arm, shoulder or hand problem limited your work or other regular daily activities  Slightly    Arm, shoulder, or hand pain.  None    Tingling (pins and needles) in your arm, shoulder, or hand  None    Difficulty Sleeping   No difficulty    DASH Score  15.91 %            [facility] Adult PT Treatment/Exercise - 09/13/17 0001      Elbow Exercises   Elbow Flexion  Strengthening;Right;Left;10 reps;Supine    Bar Weights/Barbell (Elbow Flexion)  4 lbs    Elbow Extension  Strengthening;Right;Left;10 reps;Supine    Bar Weights/Barbell (Elbow Extension)  4 lbs      Shoulder Exercises: Supine   Protraction  Strengthening;Left;10 reps;Weights    Protraction Weight (lbs)  3 # on left, 4# onright     Other Supine Exercises  holding a 3 # weight for circles and figure 8 with stops and starts     Other Supine Exercises  lying supine over foam rolller for unilatera and altenating marching, arm raises, arm and leg raises, bridges. bilateral horizontal abduction       Manual Therapy   Myofascial Release  to left pec major and anteior shoulder area     Passive ROM  with pulling to shoulder in end range of flexion, abdution and external rotation                      Long Term Clinic Goals - 09/13/17 1656      CC Long Term Goal  #1   Title  Pt will reduce Quick DASH Score to < 10 demonstrating improved function of left arm     Baseline  36.36 at eval; 18.18 on 09/03/17, 15.91 on 09/13/2017    Time  4    Period  Weeks    Status  Partially Met      CC Long Term Goal  #2   Title  Pt will increase left grip strength to an average of 45 indicating an improvment of left UE stretngth     Baseline  33 at eval; 40 on 09/03/17    Period  Weeks    Status  Partially Met      CC Long Term Goal  #3   Title  Pt will be independent in a home exercise program for strength and ROM of left arm     Time  4    Period  Weeks    Status  Achieved      CC Long Term Goal  #4   Title  left shoulder active flexion to at least 160 degrees    Baseline  155 as of 08/27/17 and 09/03/17, 164 on 09/13/2017    Period  Weeks    Status  Achieved  CC Long Term Goal  #5   Title  left shoulder active abduction to at least 160  degrees    Baseline  156 as of 08/27/17; 175 on 09/03/17    Status  Achieved      CC Long Term Goal  #6   Title  left shoulder external rotation to at least 85 degrees    Baseline  65 on 09/03/17, 85 on 09/13/2017    Status  Achieved         Plan - 09/13/17 1653    Clinical Impression Statement  Pt is doing well, but continues to have tighness in pec major and minor area that persists after streching and myofascial release.  She is adding strengthening exercises, but still has decreased dynamometer readings for left hand grip strenght and a decreased quick DASH score.  She will suspend therapy until she received MD ok to return after implant surgery on Dec. 31.     Rehab Potential  Good    Clinical Impairments Affecting Rehab Potential  ongoing expander fills     PT Frequency  2x / week    PT Duration  4 weeks    PT Treatment/Interventions  ADLs/Self Care Home Management;Patient/family education;Functional mobility training;Therapeutic activities;Manual techniques;Therapeutic exercise;Neuromuscular re-education;Passive range of motion    PT Next Visit Plan  Re-evaluate, updated goals and send recert when she returns after surgery     Consulted and Agree with Plan of Care  Patient       Patient will benefit from skilled therapeutic intervention in order to improve the following deficits and impairments:  Decreased endurance, Decreased knowledge of precautions, Decreased activity tolerance, Decreased knowledge of use of DME, Decreased strength, Impaired UE functional use, Pain, Increased muscle spasms, Decreased range of motion  Visit Diagnosis: Stiffness of left shoulder joint  Aftercare following surgery for neoplasm  Acute pain of left shoulder  Muscle weakness (generalized)     Problem List Patient Active Problem List   Diagnosis Date Noted  . Breast cancer, left breast ([place]) 06/12/2017  . Genetic testing 04/26/2017  . Family history of breast cancer   . Family history of  melanoma   . Family history of colon cancer   . 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 Carcinoma of upper-outer quadrant of left female breast ([place]) 04/13/2017   Donato Heinz. Owens Shark PT  Norwood Levo 09/13/2017, 4:59 PM  [place], [place], [address] Phone: [phone]   Fax:  [phone]  Name: Sonita Michiels MRN: [id number] Date of Birth: 1976/08/31  PHYSICAL THERAPY DISCHARGE SUMMARY  Visits from Start of Care: 10  Current functional level related to goals / functional outcomes: Unknown. Pt did not return after surgery    Remaining deficits: unknown  Education / Equipment: Home exercise, lymphedema risk reduction  Plan: Patient agrees to discharge.  Patient goals were met. Patient is being discharged due to not returning since the last visit.  ?????    Maudry Diego, PT 11/29/17 12:56 PM

## 2017-09-17 ENCOUNTER — Encounter: Payer: Managed Care, Other (non HMO) | Admitting: Physical Therapy

## 2017-09-19 ENCOUNTER — Ambulatory Visit: Payer: Managed Care, Other (non HMO)

## 2017-09-20 ENCOUNTER — Other Ambulatory Visit: Payer: Self-pay

## 2017-09-20 ENCOUNTER — Encounter (HOSPITAL_BASED_OUTPATIENT_CLINIC_OR_DEPARTMENT_OTHER): Payer: Self-pay | Admitting: *Deleted

## 2017-09-21 NOTE — Progress Notes (Signed)
Pt picked up hibiclins bottle along with ensure drink this am.  Instructions given on both and pt. Verbalized understanding

## 2017-09-26 ENCOUNTER — Telehealth: Payer: Self-pay

## 2017-09-26 NOTE — Telephone Encounter (Signed)
Spoke with patient reminding her of [facility] appt on 1/419 at 10 am.  Pt stated she will come to appt.

## 2017-09-30 NOTE — Anesthesia Preprocedure Evaluation (Addendum)
Anesthesia Evaluation  Patient identified by MRN, date of birth, ID band Patient awake    Reviewed: Allergy & Precautions, NPO status , Patient's Chart, lab work & pertinent test results  Airway Mallampati: I  TM Distance: >3 FB Neck ROM: Full    Dental  (+) Teeth Intact, Dental Advisory Given   Pulmonary asthma ,    breath sounds clear to auscultation       Cardiovascular negative cardio ROS   Rhythm:Regular Rate:Normal     Neuro/Psych negative neurological ROS     GI/Hepatic negative GI ROS, Neg liver ROS,   Endo/Other  negative endocrine ROS  Renal/GU negative Renal ROS     Musculoskeletal negative musculoskeletal ROS (+)   Abdominal   Peds  Hematology negative hematology ROS (+)   Anesthesia Other Findings Day of surgery medications reviewed with the patient.  Reproductive/Obstetrics                             Anesthesia Physical  Anesthesia Plan  ASA: II  Anesthesia Plan: General   Post-op Pain Management:    Induction: Intravenous  PONV Risk Score and Plan: 4 or greater and Ondansetron, Dexamethasone, Midazolam and Scopolamine patch - Pre-op  Airway Management Planned: Oral ETT  Additional Equipment:   Intra-op Plan:   Post-operative Plan: Extubation in OR  Informed Consent: I have reviewed the patients History and Physical, chart, labs and discussed the procedure including the risks, benefits and alternatives for the proposed anesthesia with the patient or authorized representative who has indicated his/her understanding and acceptance.   Dental advisory given  Plan Discussed with: CRNA  Anesthesia Plan Comments: (LMA 3 in past)       Anesthesia Quick Evaluation

## 2017-10-01 ENCOUNTER — Encounter (HOSPITAL_BASED_OUTPATIENT_CLINIC_OR_DEPARTMENT_OTHER)
Admission: RE | Disposition: A | Discharge status: Home / Self Care | Payer: Self-pay | Source: Ambulatory Visit | Attending: Plastic Surgery

## 2017-10-01 ENCOUNTER — Encounter (HOSPITAL_BASED_OUTPATIENT_CLINIC_OR_DEPARTMENT_OTHER): Payer: Self-pay | Admitting: *Deleted

## 2017-10-01 ENCOUNTER — Ambulatory Visit (HOSPITAL_BASED_OUTPATIENT_CLINIC_OR_DEPARTMENT_OTHER): Payer: Managed Care, Other (non HMO) | Admitting: Anesthesiology

## 2017-10-01 ENCOUNTER — Ambulatory Visit (HOSPITAL_BASED_OUTPATIENT_CLINIC_OR_DEPARTMENT_OTHER)
Admission: RE | Admit: 2017-10-01 | Discharge: 2017-10-01 | Disposition: A | Discharge status: Home / Self Care | Payer: Managed Care, Other (non HMO) | Source: Ambulatory Visit | Attending: Plastic Surgery | Admitting: Plastic Surgery

## 2017-10-01 DIAGNOSIS — N61 Mastitis without abscess: Secondary | ICD-10-CM | POA: Diagnosis not present

## 2017-10-01 DIAGNOSIS — J45909 Unspecified asthma, uncomplicated: Secondary | ICD-10-CM | POA: Insufficient documentation

## 2017-10-01 DIAGNOSIS — Z853 Personal history of malignant neoplasm of breast: Secondary | ICD-10-CM | POA: Insufficient documentation

## 2017-10-01 DIAGNOSIS — N651 Disproportion of reconstructed breast: Secondary | ICD-10-CM | POA: Diagnosis not present

## 2017-10-01 DIAGNOSIS — Z881 Allergy status to other antibiotic agents status: Secondary | ICD-10-CM | POA: Diagnosis not present

## 2017-10-01 DIAGNOSIS — Z9012 Acquired absence of left breast and nipple: Secondary | ICD-10-CM | POA: Diagnosis not present

## 2017-10-01 HISTORY — PX: BREAST RECONSTRUCTION: SHX9

## 2017-10-01 HISTORY — PX: REMOVAL OF TISSUE EXPANDER AND PLACEMENT OF IMPLANT: SHX6457

## 2017-10-01 HISTORY — PX: BREAST ENHANCEMENT SURGERY: SHX7

## 2017-10-01 SURGERY — REMOVAL, TISSUE EXPANDER, BREAST, WITH IMPLANT INSERTION
Anesthesia: General | Site: Breast | Laterality: Right

## 2017-10-01 MED ORDER — FENTANYL CITRATE (PF) 100 MCG/2ML IJ SOLN
INTRAMUSCULAR | Status: AC
  Filled 2017-10-01: qty 2

## 2017-10-01 MED ORDER — SCOPOLAMINE 1 MG/3DAYS TD PT72
MEDICATED_PATCH | TRANSDERMAL | Status: AC
  Filled 2017-10-01: qty 1

## 2017-10-01 MED ORDER — EPHEDRINE SULFATE 50 MG/ML IJ SOLN
INTRAMUSCULAR | Status: DC | PRN
  Administered 2017-10-01: 10 mg via INTRAVENOUS

## 2017-10-01 MED ORDER — CELECOXIB 200 MG PO CAPS
ORAL_CAPSULE | ORAL | Status: AC
  Filled 2017-10-01: qty 1

## 2017-10-01 MED ORDER — LIDOCAINE 2% (20 MG/ML) 5 ML SYRINGE
INTRAMUSCULAR | Status: DC | PRN
  Administered 2017-10-01: 100 mg via INTRAVENOUS

## 2017-10-01 MED ORDER — OXYCODONE HCL 5 MG PO TABS
5.0000 mg | ORAL_TABLET | Freq: Once | ORAL | Status: AC
  Administered 2017-10-01: 5 mg via ORAL

## 2017-10-01 MED ORDER — MEPERIDINE HCL 25 MG/ML IJ SOLN: 6.2500 mg | INTRAMUSCULAR | Status: DC | PRN

## 2017-10-01 MED ORDER — ONDANSETRON HCL 4 MG/2ML IJ SOLN
INTRAMUSCULAR | Status: AC
  Filled 2017-10-01: qty 2

## 2017-10-01 MED ORDER — LIDOCAINE 2% (20 MG/ML) 5 ML SYRINGE
INTRAMUSCULAR | Status: AC
  Filled 2017-10-01: qty 5

## 2017-10-01 MED ORDER — MIDAZOLAM HCL 2 MG/2ML IJ SOLN
1.0000 mg | INTRAMUSCULAR | Status: DC | PRN
  Administered 2017-10-01: 2 mg via INTRAVENOUS

## 2017-10-01 MED ORDER — SCOPOLAMINE 1 MG/3DAYS TD PT72
1.0000 | MEDICATED_PATCH | Freq: Once | TRANSDERMAL | Status: DC | PRN
  Administered 2017-10-01: 1.5 mg via TRANSDERMAL

## 2017-10-01 MED ORDER — GABAPENTIN 300 MG PO CAPS
300.0000 mg | ORAL_CAPSULE | ORAL | Status: AC
  Administered 2017-10-01: 300 mg via ORAL

## 2017-10-01 MED ORDER — FENTANYL CITRATE (PF) 100 MCG/2ML IJ SOLN
50.0000 ug | INTRAMUSCULAR | Status: AC | PRN
  Administered 2017-10-01: 50 ug via INTRAVENOUS
  Administered 2017-10-01: 100 ug via INTRAVENOUS
  Administered 2017-10-01 (×2): 25 ug via INTRAVENOUS

## 2017-10-01 MED ORDER — ACETAMINOPHEN 500 MG PO TABS
1000.0000 mg | ORAL_TABLET | ORAL | Status: AC
  Administered 2017-10-01: 1000 mg via ORAL

## 2017-10-01 MED ORDER — MIDAZOLAM HCL 2 MG/2ML IJ SOLN
INTRAMUSCULAR | Status: AC
  Filled 2017-10-01: qty 2

## 2017-10-01 MED ORDER — LIDOCAINE-EPINEPHRINE 1 %-1:100000 IJ SOLN
INTRAMUSCULAR | Status: AC
  Filled 2017-10-01: qty 1

## 2017-10-01 MED ORDER — PROPOFOL 10 MG/ML IV BOLUS
INTRAVENOUS | Status: DC | PRN
  Administered 2017-10-01: 200 mg via INTRAVENOUS

## 2017-10-01 MED ORDER — GABAPENTIN 300 MG PO CAPS
ORAL_CAPSULE | ORAL | Status: AC
  Filled 2017-10-01: qty 1

## 2017-10-01 MED ORDER — OXYCODONE HCL 5 MG PO TABS
ORAL_TABLET | ORAL | Status: AC
  Filled 2017-10-01: qty 1

## 2017-10-01 MED ORDER — PROPOFOL 500 MG/50ML IV EMUL
INTRAVENOUS | Status: AC
  Filled 2017-10-01: qty 50

## 2017-10-01 MED ORDER — CHLORHEXIDINE GLUCONATE CLOTH 2 % EX PADS: 6.0000 | MEDICATED_PAD | Freq: Once | CUTANEOUS | Status: DC

## 2017-10-01 MED ORDER — BACITRACIN ZINC 500 UNIT/GM EX OINT
TOPICAL_OINTMENT | CUTANEOUS | Status: AC
  Filled 2017-10-01: qty 28.35

## 2017-10-01 MED ORDER — CEFAZOLIN SODIUM-DEXTROSE 2-4 GM/100ML-% IV SOLN
INTRAVENOUS | Status: AC
  Filled 2017-10-01: qty 100

## 2017-10-01 MED ORDER — DEXAMETHASONE SODIUM PHOSPHATE 4 MG/ML IJ SOLN
INTRAMUSCULAR | Status: DC | PRN
  Administered 2017-10-01: 10 mg via INTRAVENOUS

## 2017-10-01 MED ORDER — CELECOXIB 200 MG PO CAPS
200.0000 mg | ORAL_CAPSULE | ORAL | Status: AC
  Administered 2017-10-01: 200 mg via ORAL

## 2017-10-01 MED ORDER — DEXAMETHASONE SODIUM PHOSPHATE 10 MG/ML IJ SOLN
INTRAMUSCULAR | Status: AC
  Filled 2017-10-01: qty 1

## 2017-10-01 MED ORDER — BUPIVACAINE-EPINEPHRINE 0.25% -1:200000 IJ SOLN
INTRAMUSCULAR | Status: DC | PRN
  Administered 2017-10-01: 12 mL

## 2017-10-01 MED ORDER — PROMETHAZINE HCL 25 MG/ML IJ SOLN: 6.2500 mg | INTRAMUSCULAR | Status: DC | PRN

## 2017-10-01 MED ORDER — ONDANSETRON HCL 4 MG/2ML IJ SOLN
INTRAMUSCULAR | Status: DC | PRN
  Administered 2017-10-01: 4 mg via INTRAVENOUS

## 2017-10-01 MED ORDER — FENTANYL CITRATE (PF) 100 MCG/2ML IJ SOLN
25.0000 ug | INTRAMUSCULAR | Status: DC | PRN
  Administered 2017-10-01: 25 ug via INTRAVENOUS
  Administered 2017-10-01: 50 ug via INTRAVENOUS
  Administered 2017-10-01 (×3): 25 ug via INTRAVENOUS

## 2017-10-01 MED ORDER — ACETAMINOPHEN 500 MG PO TABS
ORAL_TABLET | ORAL | Status: AC
  Filled 2017-10-01: qty 2

## 2017-10-01 MED ORDER — LACTATED RINGERS IV SOLN
INTRAVENOUS | Status: DC
  Administered 2017-10-01 (×2): via INTRAVENOUS

## 2017-10-01 MED ORDER — BUPIVACAINE-EPINEPHRINE (PF) 0.25% -1:200000 IJ SOLN
INTRAMUSCULAR | Status: AC
  Filled 2017-10-01: qty 30

## 2017-10-01 MED ORDER — CEFAZOLIN SODIUM-DEXTROSE 2-4 GM/100ML-% IV SOLN
2.0000 g | INTRAVENOUS | Status: AC
  Administered 2017-10-01: 2 g via INTRAVENOUS

## 2017-10-01 MED ORDER — SODIUM CHLORIDE 0.9 % IV SOLN
INTRAVENOUS | Status: DC | PRN
  Administered 2017-10-01: 1000 mL

## 2017-10-01 SURGICAL SUPPLY — 97 items
BAG DECANTER FOR FLEXI CONT (MISCELLANEOUS) ×3 IMPLANT
BINDER BREAST LRG (GAUZE/BANDAGES/DRESSINGS) IMPLANT
BINDER BREAST MEDIUM (GAUZE/BANDAGES/DRESSINGS) ×3 IMPLANT
BINDER BREAST XLRG (GAUZE/BANDAGES/DRESSINGS) IMPLANT
BINDER BREAST XXLRG (GAUZE/BANDAGES/DRESSINGS) IMPLANT
BLADE CLIPPER SURG (BLADE) IMPLANT
BLADE SURG 10 STRL SS (BLADE) ×9 IMPLANT
BLADE SURG 11 STRL SS (BLADE) ×3 IMPLANT
BLADE SURG 15 STRL LF DISP TIS (BLADE) ×2 IMPLANT
BLADE SURG 15 STRL SS (BLADE) ×1
BNDG GAUZE ELAST 4 BULKY (GAUZE/BANDAGES/DRESSINGS) ×6 IMPLANT
CANISTER SUCT 1200ML W/VALVE (MISCELLANEOUS) ×3 IMPLANT
CHLORAPREP W/TINT 26ML (MISCELLANEOUS) ×3 IMPLANT
COTTONBALL LRG STERILE PKG (GAUZE/BANDAGES/DRESSINGS) IMPLANT
COVER BACK TABLE 60X90IN (DRAPES) ×3 IMPLANT
COVER MAYO STAND STRL (DRAPES) ×3 IMPLANT
DECANTER SPIKE VIAL GLASS SM (MISCELLANEOUS) IMPLANT
DERMABOND ADVANCED (GAUZE/BANDAGES/DRESSINGS) ×2
DERMABOND ADVANCED .7 DNX12 (GAUZE/BANDAGES/DRESSINGS) ×4 IMPLANT
DRAIN CHANNEL 15F RND FF W/TCR (WOUND CARE) IMPLANT
DRAPE LAPAROSCOPIC ABDOMINAL (DRAPES) IMPLANT
DRAPE TOP ARMCOVERS (MISCELLANEOUS) ×3 IMPLANT
DRAPE U-SHAPE 76X120 STRL (DRAPES) ×3 IMPLANT
DRAPE UTILITY XL STRL (DRAPES) IMPLANT
DRSG PAD ABDOMINAL 8X10 ST (GAUZE/BANDAGES/DRESSINGS) ×6 IMPLANT
ELECT BLADE 4.0 EZ CLEAN MEGAD (MISCELLANEOUS) ×3
ELECT COATED BLADE 2.86 ST (ELECTRODE) ×3 IMPLANT
ELECT NEEDLE BLADE 2-5/6 (NEEDLE) ×3 IMPLANT
ELECT REM PT RETURN 9FT ADLT (ELECTROSURGICAL) ×3
ELECTRODE BLDE 4.0 EZ CLN MEGD (MISCELLANEOUS) ×2 IMPLANT
ELECTRODE REM PT RTRN 9FT ADLT (ELECTROSURGICAL) ×2 IMPLANT
EVACUATOR SILICONE 100CC (DRAIN) IMPLANT
GAUZE XEROFORM 1X8 LF (GAUZE/BANDAGES/DRESSINGS) IMPLANT
GAUZE XEROFORM 5X9 LF (GAUZE/BANDAGES/DRESSINGS) IMPLANT
GLOVE BIO SURGEON STRL SZ 6 (GLOVE) ×6 IMPLANT
GLOVE BIO SURGEON STRL SZ 6.5 (GLOVE) IMPLANT
GLOVE BIOGEL PI IND STRL 6.5 (GLOVE) ×2 IMPLANT
GLOVE BIOGEL PI IND STRL 7.0 (GLOVE) ×2 IMPLANT
GLOVE BIOGEL PI INDICATOR 6.5 (GLOVE) ×1
GLOVE BIOGEL PI INDICATOR 7.0 (GLOVE) ×1
GLOVE ECLIPSE 6.5 STRL STRAW (GLOVE) ×6 IMPLANT
GOWN STRL REUS W/ TWL LRG LVL3 (GOWN DISPOSABLE) ×4 IMPLANT
GOWN STRL REUS W/TWL LRG LVL3 (GOWN DISPOSABLE) ×2
IMPL BREAST FULL RND 295 (Breast) ×2 IMPLANT
IMPL BREAST GEL 195CC (Breast) ×2 IMPLANT
IMPL BRST FULL RND 295CC (Breast) ×2 IMPLANT
IMPLANT BREAST GEL 195CC (Breast) ×3 IMPLANT
IMPLANT BREAST GEL 295CC (Breast) ×1 IMPLANT
INSPIRA F 240 RESTERILIZABLE SIZER ×3 IMPLANT
IV NS 500ML (IV SOLUTION)
IV NS 500ML BAXH (IV SOLUTION) IMPLANT
KIT FILL SYSTEM UNIVERSAL (SET/KITS/TRAYS/PACK) IMPLANT
MARKER SKIN DUAL TIP RULER LAB (MISCELLANEOUS) IMPLANT
NDL SAFETY ECLIPSE 18X1.5 (NEEDLE) ×2 IMPLANT
NEEDLE HYPO 18GX1.5 SHARP (NEEDLE) ×1
NEEDLE PRECISIONGLIDE 27X1.5 (NEEDLE) ×3 IMPLANT
PACK BASIN DAY SURGERY FS (CUSTOM PROCEDURE TRAY) ×3 IMPLANT
PENCIL BUTTON HOLSTER BLD 10FT (ELECTRODE) ×3 IMPLANT
PIN SAFETY STERILE (MISCELLANEOUS) IMPLANT
SHEET MEDIUM DRAPE 40X70 STRL (DRAPES) ×6 IMPLANT
SIZER BREAST 195CC (SIZER) ×3
SIZER BREAST 210CC (SIZER) ×3
SIZER BREAST 265CC (SIZER) ×1
SIZER BREAST REUSE 295CC (SIZER) ×1
SIZER BRST 195CC (SIZER) ×2 IMPLANT
SIZER BRST 210CC (SIZER) ×2 IMPLANT
SIZER BRST P4.4X 265CC RND (SIZER) ×2 IMPLANT
SIZER BRST REUSE P4.5X 295CC (SIZER) ×2 IMPLANT
SLEEVE SCD COMPRESS KNEE MED (MISCELLANEOUS) ×3 IMPLANT
SPONGE GAUZE 2X2 8PLY STRL LF (GAUZE/BANDAGES/DRESSINGS) IMPLANT
SPONGE LAP 18X18 X RAY DECT (DISPOSABLE) ×6 IMPLANT
STAPLER VISISTAT 35W (STAPLE) ×3 IMPLANT
STRIP CLOSURE SKIN 1/2X4 (GAUZE/BANDAGES/DRESSINGS) IMPLANT
STRIP CLOSURE SKIN 1/4X4 (GAUZE/BANDAGES/DRESSINGS) IMPLANT
SUT CHROMIC 5 0 P 3 (SUTURE) ×6 IMPLANT
SUT ETHILON 2 0 FS 18 (SUTURE) ×6 IMPLANT
SUT MNCRL AB 4-0 PS2 18 (SUTURE) ×6 IMPLANT
SUT PDS AB 2-0 CT2 27 (SUTURE) ×3 IMPLANT
SUT PLAIN 5 0 P 3 18 (SUTURE) IMPLANT
SUT PROLENE 5 0 P 3 (SUTURE) IMPLANT
SUT PROLENE 5 0 PS 2 (SUTURE) IMPLANT
SUT PROLENE 6 0 P 1 18 (SUTURE) IMPLANT
SUT SILK 2 0 SH (SUTURE) IMPLANT
SUT SILK 4 0 P 3 (SUTURE) ×3 IMPLANT
SUT VIC AB 3-0 PS1 18 (SUTURE)
SUT VIC AB 3-0 PS1 18XBRD (SUTURE) IMPLANT
SUT VIC AB 3-0 SH 27 (SUTURE) ×2
SUT VIC AB 3-0 SH 27X BRD (SUTURE) ×4 IMPLANT
SUT VICRYL 4-0 PS2 18IN ABS (SUTURE) ×6 IMPLANT
SYR BULB 3OZ (MISCELLANEOUS) IMPLANT
SYR BULB IRRIGATION 50ML (SYRINGE) ×6 IMPLANT
SYR CONTROL 10ML LL (SYRINGE) ×3 IMPLANT
TOWEL OR 17X24 6PK STRL BLUE (TOWEL DISPOSABLE) ×6 IMPLANT
TRAY DSU PREP LF (CUSTOM PROCEDURE TRAY) IMPLANT
TUBE CONNECTING 20X1/4 (TUBING) ×6 IMPLANT
UNDERPAD 30X30 (UNDERPADS AND DIAPERS) ×6 IMPLANT
YANKAUER SUCT BULB TIP NO VENT (SUCTIONS) ×3 IMPLANT

## 2017-10-01 NOTE — Transfer of Care (Signed)
Immediate Anesthesia Transfer of Care Note  Patient: Belkis Norbeck  Procedure(s) Performed: REMOVAL OF TISSUE EXPANDER AND PLACEMENT OF LEFT  IMPLANT (Left Breast) RIGHT AUGMENTATION FOR SYMMETRY (Right Breast) NIPPLE SHARING RECONSTRUCTION LEFT CHEST (Left Breast)  Patient Location: PACU  Anesthesia Type:General  Level of Consciousness: sedated  Airway & Oxygen Therapy: Patient Spontanous Breathing and Patient connected to face mask oxygen  Post-op Assessment: Report given to RN and Post -op Vital signs reviewed and stable  Post vital signs: Reviewed and stable  Last Vitals:  Vitals:   10/01/17 0634  BP: 115/69  Pulse: 65  Temp: 36.8 C  SpO2: 100%    Last Pain:  Vitals:   10/01/17 0634  TempSrc: Oral  PainSc: 0-No pain         Complications: No apparent anesthesia complications

## 2017-10-01 NOTE — Anesthesia Postprocedure Evaluation (Signed)
Anesthesia Post Note  Patient: Shalina [place]  Procedure(s) Performed: REMOVAL OF TISSUE EXPANDER AND PLACEMENT OF LEFT  IMPLANT (Left Breast) RIGHT AUGMENTATION FOR SYMMETRY (Right Breast) NIPPLE SHARING RECONSTRUCTION LEFT CHEST (Left Breast)     Patient location during evaluation: PACU Anesthesia Type: General Level of consciousness: sedated and patient cooperative Pain management: pain level controlled Vital Signs Assessment: post-procedure vital signs reviewed and stable Respiratory status: spontaneous breathing Cardiovascular status: stable Anesthetic complications: no    Last Vitals:  Vitals:   10/01/17 1045 10/01/17 1146  BP: 114/66 120/69  Pulse: (!) 57 (!) 54  Resp: 13 16  Temp:  36.4 C  SpO2: 100% 100%    Last Pain:  Vitals:   10/01/17 1146  TempSrc: Oral  PainSc: [place]

## 2017-10-01 NOTE — Anesthesia Procedure Notes (Signed)
Procedure Name: LMA Insertion Date/Time: 10/01/2017 7:33 AM Performed by: Maryella Shivers, CRNA Pre-anesthesia Checklist: Patient identified, Emergency Drugs available, Suction available and Patient being monitored Patient Re-evaluated:Patient Re-evaluated prior to induction Oxygen Delivery Method: Circle system utilized Preoxygenation: Pre-oxygenation with 100% oxygen Induction Type: IV induction Ventilation: Mask ventilation without difficulty LMA: LMA inserted LMA Size: 4.0 Number of attempts: 1 Airway Equipment and Method: Bite block Placement Confirmation: positive ETCO2 Tube secured with: Tape Dental Injury: Teeth and Oropharynx as per pre-operative assessment

## 2017-10-01 NOTE — Op Note (Signed)
Operative Note   DATE OF OPERATION: 12.31.18  LOCATION: [facility]-outpatient  SURGICAL DIVISION: Plastic Surgery  PREOPERATIVE DIAGNOSES:  1. Acquired absence left breast 2. History breast cancer 3. Asymmetry native and reconstructed breast  POSTOPERATIVE DIAGNOSES:  same  PROCEDURE:  1. Removal left tissue expander and placement silicone implant 2. Right breast augmentation for symmetry 3. Left nipple reconstruction via nipple sharing (composite graft)  SURGEON: Irene Limbo MD MBA  ASSISTANT: none  ANESTHESIA:  General.   EBL: 25 ml  COMPLICATIONS: None immediate.   INDICATIONS FOR PROCEDURE:  The patient, Meagan [name], is a 41 y.o. female born on 01-Jul-1976, is here for second stage breast reconstruction following left nipple sparing mastectomy with immediate expander acellular dermis reconstruction. The left nipple was subsequently resected for margins. Plan nipple sharing for reconstruction.   FINDINGS: Meagan [name] Smooth Round implants placed bilateral. RIGHT Moderate Projection, 195 ml, REF [id number] LEFT Full Projection 295 ml, REF [id number] SN [id number]  DESCRIPTION OF PROCEDURE:  The patient's operative site was marked with the patient in the preoperative area. The patient was taken to the operating room. SCDs were placed and IV antibiotics were given. The patient's operative site was prepped and draped in a sterile fashion. A time out was performed and all information was confirmed to be correct. Right nipple shield Tegaderm placed. Incision made in left inframammary fold scar and carried through superficial fascia to implant capsule. Expander removed. Full incorporation ADM noted. Capsulotomies performed medially and superiorly. The anterior capsule was scored in radial fashion. Sizer placed. I then directed attention to right breast. Inframammary fold incision made and carried to chest wall. Lateral border pectoralis muscle identified and dual plane  dissection completed to accommodate implant. The inframammary fold was stabilized with interrupted 2-0 PDS from superficial fascia of caudal incision to chest wall. Sizer placed and patient brought to sitting position. Moderate profile 195 ml selected for right breast and Full profile 295 ml implant selected for left chest. The patient was returned to supine position. Cavities irrigated with saline solution containing Ancef, gentamicin, and bacitracin. Hemostasis ensured. Each cavity then irrigated with Betadine. Implant placed in right breast. Orientation ensured and closure completed with 3-0 vicryl in superficial fascia, 4-0 vicryl in dermis and 4-0 monocryl subcuticular skin closure. Over left chest, implant placed, orientation ensured and closure completed in similar fashion.   At this time the right nipple Tegaderm removed and inferior half nipple sharply excised. The left areola deepithelialized in area marked preoperatively for nipple placement. The graft was inset with 5-0 chromic suture. The hub of a syringe was cut and used as protective dressing and sewn to left reconstructed breast with 2-0 nylon. Over right nipple the wound was primarily closed with 5-0 chromic suture. Antibiotic ointment applied to each nipple. Dermabond applied to IMF incisions. Dry dressing and breast binder applied.  The patient was allowed to wake from anesthesia, extubated and taken to the recovery room in satisfactory condition.   SPECIMENS: left areola  DRAINS: none  Irene Limbo, MD [facility] Plastic & Reconstructive Surgery [phone], pin [phone]

## 2017-10-01 NOTE — Interval H&P Note (Signed)
History and Physical Interval Note:  10/01/2017 6:48 AM  Meagan [name]  has presented today for surgery, with the diagnosis of HISTORY OF BREAST CANCER  The various methods of treatment have been discussed with the patient and family. After consideration of risks, benefits and other options for treatment, the patient has consented to  Procedure(s): REMOVAL OF TISSUE EXPANDER AND PLACEMENT OF LEFT  IMPLANT (Left) RIGHT AUGMENTATION FOR SYMMETRY (Right) POSSIBLE NIPPLE SHARING RECONSTRUCTION LEFT CHEST (Left) as a surgical intervention .  The patient's history has been reviewed, patient examined, no change in status, stable for surgery.  I have reviewed the patient's chart and labs.  Questions were answered to the patient's satisfaction.     Jabria Loos

## 2017-10-01 NOTE — Discharge Instructions (Signed)
May take Oxycodone at 3:30PM if needed May take Tylenol at 1PM if needed  Post Anesthesia Home Care Instructions  Activity: Get plenty of rest for the remainder of the day. A responsible individual must stay with you for 24 hours following the procedure.  For the next 24 hours, DO NOT: -Drive a car -Paediatric nurse -Drink alcoholic beverages -Take any medication unless instructed by your physician -Make any legal decisions or sign important papers.  Meals: Start with liquid foods such as gelatin or soup. Progress to regular foods as tolerated. Avoid greasy, spicy, heavy foods. If nausea and/or vomiting occur, drink only clear liquids until the nausea and/or vomiting subsides. Call your physician if vomiting continues.  Special Instructions/Symptoms: Your throat may feel dry or sore from the anesthesia or the breathing tube placed in your throat during surgery. If this causes discomfort, gargle with warm salt water. The discomfort should disappear within 24 hours.  If you had a scopolamine patch placed behind your ear for the management of post- operative nausea and/or vomiting:  1. The medication in the patch is effective for 72 hours, after which it should be removed.  Wrap patch in a tissue and discard in the trash. Wash hands thoroughly with soap and water. 2. You may remove the patch earlier than 72 hours if you experience unpleasant side effects which may include dry mouth, dizziness or visual disturbances. 3. Avoid touching the patch. Wash your hands with soap and water after contact with the patch.

## 2017-10-05 ENCOUNTER — Telehealth: Payer: Self-pay

## 2017-10-05 ENCOUNTER — Ambulatory Visit (HOSPITAL_BASED_OUTPATIENT_CLINIC_OR_DEPARTMENT_OTHER): Payer: PRIVATE HEALTH INSURANCE | Admitting: Adult Health

## 2017-10-05 ENCOUNTER — Telehealth: Payer: Self-pay | Admitting: Adult Health

## 2017-10-05 ENCOUNTER — Encounter: Payer: Self-pay | Admitting: Adult Health

## 2017-10-05 VITALS — BP 116/58 | HR 63 | Temp 98.2°F | Resp 18 | Ht 66.0 in | Wt 132.9 lb

## 2017-10-05 DIAGNOSIS — Z79811 Long term (current) use of aromatase inhibitors: Secondary | ICD-10-CM | POA: Diagnosis not present

## 2017-10-05 DIAGNOSIS — Z170 Estrogen receptor positive status [ER+]: Secondary | ICD-10-CM

## 2017-10-05 DIAGNOSIS — D0512 Intraductal carcinoma in situ of left breast: Secondary | ICD-10-CM

## 2017-10-05 DIAGNOSIS — Z1239 Encounter for other screening for malignant neoplasm of breast: Secondary | ICD-10-CM

## 2017-10-05 NOTE — Telephone Encounter (Signed)
Patient declined AVs and calendar of upcoming June appointments. Will receive update in MyChart.

## 2017-10-05 NOTE — Telephone Encounter (Signed)
-----   Message from Gardenia Phlegm, NP sent at 10/05/2017  1:35 PM EST ----- Meagan [name]'s 4 year Recurrence risk per Dr. Lindi Adie is 1% with Tamoxifen, 2% without. 10 year is 1.5% with, 3% without.

## 2017-10-05 NOTE — Progress Notes (Signed)
CLINIC:  Survivorship   REASON FOR VISIT:  Routine follow-up post-treatment for a recent history of breast cancer.  BRIEF ONCOLOGIC HISTORY:    Ductal carcinoma in situ (DCIS) of left breast   04/10/2017 Initial Diagnosis    Left breast biopsy 12:00 and 12:30 position: DCIS, grade 1; screening mammogram detected a 9 mm and a 5 mm abnormality in the left breast      04/21/2017 Genetic Testing    BRCA2 c.5937C>G VUS identified on the STAT panel.  The STAT Breast cancer panel offered by [facility] includes sequencing and rearrangement analysis for the following 9 genes:  ATM, BRCA1, BRCA2, CDH1, CHEK2, PALB2, PTEN, STK11 and TP53.   The report date is April 21, 2017.    Testing was reflexed to the larger common hereditary cancer panel and the melanoma cancer panel. This testing was NEGATIVE.  The Hereditary Gene Panel offered by [facility] includes sequencing and/or deletion duplication testing of the following 46 genes: APC, ATM, AXIN2, BARD1, BMPR1A, BRCA1, BRCA2, BRIP1, CDH1, CDKN2A (p14ARF), CDKN2A (p16INK4a), CHEK2, CTNNA1, DICER1, EPCAM (Deletion/duplication testing only), GREM1 (promoter region deletion/duplication testing only), KIT, MEN1, MLH1, MSH2, MSH3, MSH6, MUTYH, NBN, NF1, NHTL1, PALB2, PDGFRA, PMS2, POLD1, POLE, PTEN, RAD50, RAD51C, RAD51D, SDHB, SDHC, SDHD, SMAD4, SMARCA4. STK11, TP53, TSC1, TSC2, and VHL.  The following genes were evaluated for sequence changes only: SDHA and HOXB13 c.251G>A variant only.  The Melanoma panel offered by [facility] includes sequencing and/or deletion duplication testing of the following 12 genes: BAP1, BRCA1, BRCA2, BRIP1, CDK4, CDKN2A (p14ARF), CDKN2A (p16INK4a), MC1R, POT1, PTEN, RB1, TERT, and TP53.  The following gene was evaluated for sequence changes only: MITF (c.952G>A, p.GLU318Lys variant only).   The report date is April 29, 2017.        06/12/2017 Surgery    Left Mastectomy: Low-grade DCIS 2.7 cm, margins negative, ER + PR +, Tis Nx stage 0      07/2017 -  Anti-estrogen oral therapy    Tamoxifen daily       INTERVAL HISTORY:  Ms. Rubey presents to the [facility] today for our initial meeting to review her survivorship care plan detailing her treatment course for breast cancer, as well as monitoring long-term side effects of that treatment, education regarding health maintenance, screening, and overall wellness and health promotion.     Overall, Ms. Hogate is doing well.  She did undergo reconstruction, with implant placement on Monday.  She got implant exchange on the left where she had the mastectomy, and then an implant on the right for symmetry.  Dr. Iran Planas is her plastic surgeon.  She is taking Tamoxifen daily.  She is tolerating it well.  She doesn't note a lot of symptoms.  She has noted some menstrual irregularities.  She would previously have regular periods.  Now these start a few days later, but she says that the blood has more clots in it than it previously did prior to Tamoxifen.  There is no difference in her daily pad count.  There is no difference in the length of time she experiences her period as compared to prior.      REVIEW OF SYSTEMS:  Review of Systems  Constitutional: Negative for appetite change, chills, fatigue, fever and unexpected weight change.  HENT:   Negative for hearing loss, lump/mass and trouble swallowing.   Eyes: Negative for eye problems and icterus.  Respiratory: Negative for chest tightness, cough and shortness of breath.   Cardiovascular: Negative for chest pain, leg swelling and  palpitations.  Gastrointestinal: Negative for abdominal distention, abdominal pain, constipation, diarrhea, nausea and vomiting.  Endocrine: Negative for hot flashes.  Musculoskeletal: Negative for arthralgias.  Skin: Negative for itching and rash.  Neurological: Negative for dizziness, extremity weakness, headaches and numbness.  Hematological: Negative for adenopathy. Does not bruise/bleed easily.   Psychiatric/Behavioral: Negative for depression. The patient is not nervous/anxious.   Breast: Denies any new nodularity, masses, tenderness, nipple changes, or nipple discharge.     ONCOLOGY TREATMENT TEAM:  1. Surgeon:  Dr. Ninfa Linden at [facility] 2. Medical Oncologist: Dr. Lindi Adie  3. Plastic Surgeon: Dr. Iran Planas    PAST MEDICAL/SURGICAL HISTORY:  Past Medical History:  Diagnosis Date  . Asthma    childhood astha, no problems now  . Cancer ([place]) 05/2017   left breast DCIS  . Family history of breast cancer   . Family history of colon cancer   . Family history of melanoma    Past Surgical History:  Procedure Laterality Date  . APPENDECTOMY  10/02/2001  . BREAST ENHANCEMENT SURGERY Right 10/01/2017   Procedure: RIGHT AUGMENTATION FOR SYMMETRY;  Surgeon: Irene Limbo, MD;  Location: [place];  Service: Plastics;  Laterality: Right;  . BREAST RECONSTRUCTION Left 10/01/2017   Procedure: NIPPLE SHARING RECONSTRUCTION LEFT CHEST;  Surgeon: Irene Limbo, MD;  Location: [place];  Service: Plastics;  Laterality: Left;  . BREAST RECONSTRUCTION WITH PLACEMENT OF TISSUE EXPANDER AND FLEX HD (ACELLULAR HYDRATED DERMIS) Left 06/12/2017   Procedure: LEFT BREAST RECONSTRUCTION WITH PLACEMENT OF TISSUE EXPANDERAND ALLODERM;  Surgeon: Irene Limbo, MD;  Location: [place];  Service: Plastics;  Laterality: Left;  . EXCISION OF BREAST LESION Left 07/06/2017   Procedure: EXCISION OF LEFT BREAST NIPPLE;  Surgeon: Coralie Keens, MD;  Location: [place];  Service: General;  Laterality: Left;  . NIPPLE SPARING MASTECTOMY Left 06/12/2017   Procedure: LEFT BREAST NIPPLE SPARING MASTECTOMY;  Surgeon: Coralie Keens, MD;  Location: [place];  Service: General;  Laterality: Left;  . REMOVAL OF TISSUE EXPANDER AND PLACEMENT OF IMPLANT Left 07/17/2017   Procedure: REMOVAL OF TISSUE EXPANDER  AND PLACEMENT OF IMPLANT;  Surgeon: Irene Limbo, MD;  Location: [place];  Service: Plastics;  Laterality: Left;  . REMOVAL OF TISSUE EXPANDER AND PLACEMENT OF IMPLANT Left 10/01/2017   Procedure: REMOVAL OF TISSUE EXPANDER AND PLACEMENT OF LEFT  IMPLANT;  Surgeon: Irene Limbo, MD;  Location: [place];  Service: Plastics;  Laterality: Left;  . WISDOM TOOTH EXTRACTION       ALLERGIES:  Allergies  Allergen Reactions  . Erythromycin Nausea And Vomiting     CURRENT MEDICATIONS:  Outpatient Encounter Medications as of 10/05/2017  Medication Sig  . methocarbamol (ROBAXIN) 500 MG tablet Take 1 tablet (500 mg total) by mouth every 8 (eight) hours as needed for muscle spasms.  . Multiple Vitamin (MULTIVITAMIN WITH MINERALS) TABS tablet Take 1 tablet by mouth daily.  Marland Kitchen oxyCODONE (OXY IR/ROXICODONE) 5 MG immediate release tablet Take 1-2 tablets (5-10 mg total) by mouth every 4 (four) hours as needed for moderate pain.  Marland Kitchen sulfamethoxazole-trimethoprim (BACTRIM DS,SEPTRA DS) 800-160 MG tablet Take 1 tablet by mouth 2 (two) times daily.  . tamoxifen (NOLVADEX) 20 MG tablet Take 1 tablet (20 mg total) by mouth daily.   No facility-administered encounter medications on file as of 10/05/2017.      ONCOLOGIC FAMILY HISTORY:  Family History  Problem Relation Age of Onset  .  Cervical cancer Mother 54       adenocarcioma  . Skin cancer Mother        BCC's  . Melanoma Father        dx in his 51s  . Parkinson's disease Maternal Grandmother   . Colon cancer Maternal Grandfather        dx in his 59s  . Skin cancer Paternal Grandfather   . Breast cancer Cousin        mother's maternal cousin - x2, one dx at 72, another in her 73s  . Breast cancer Cousin        mothers paternal first cousin dx in her 27s-60s, and again in her 18s with possible DCIS        PHYSICAL EXAMINATION:  Vital Signs:   Vitals:   10/05/17 1012  BP: (!) 116/58  Pulse: 63    Resp: 18  Temp: 98.2 F (36.8 C)  SpO2: 100%   Filed Weights   10/05/17 1012  Weight: 132 lb 14.4 oz (60.3 kg)   General: Well-nourished, well-appearing female in no acute distress.  She is unaccompanied today.   HEENT: Head is normocephalic.  Pupils equal and reactive to light. Conjunctivae clear without exudate.  Sclerae anicteric. Oral mucosa is pink, moist.  Oropharynx is pink without lesions or erythema.  Lymph: No cervical, supraclavicular, or infraclavicular lymphadenopathy noted on palpation.  Cardiovascular: Regular rate and rhythm.Marland Kitchen Respiratory: Clear to auscultation bilaterally. Chest expansion symmetric; breathing non-labored.  GI: Abdomen soft and round; non-tender, non-distended. Bowel sounds normoactive.  GU: Deferred.  Neuro: No focal deficits. Steady gait.  Psych: Mood and affect normal and appropriate for situation.  Extremities: No edema. MSK: No focal spinal tenderness to palpation.  Full range of motion in bilateral upper extremities Skin: Warm and dry.  LABORATORY DATA:  None for this visit.  DIAGNOSTIC IMAGING:  None for this visit.      ASSESSMENT AND PLAN:  Ms.. Bobak is a pleasant 42 y.o. female with Stage 0 left breast DCIS, ER+/PR+, diagnosed in 04/2017, treated with mastectomy and anti estrogen therapy with Tamoxifen.  She presents to the Survivorship Clinic for our initial meeting and routine follow-up post-completion of treatment for breast cancer.    1. Stage 0 left breast cancer:  Ms. Peddie is continuing to recover from definitive treatment for breast cancer. She will follow-up with her medical oncologist, Dr. Lindi Adie in 6 months.  She is tolerating the Tamoxifen well. Today, a comprehensive survivorship care plan and treatment summary was reviewed with the patient today detailing her breast cancer diagnosis, treatment course, potential late/long-term effects of treatment, appropriate follow-up care with recommendations for the future, and patient  education resources.  A copy of this summary, along with a letter will be sent to the patient's primary care provider via mail/fax/In Basket message after today's visit.    2. Bone health:  Given Ms. Chirino's history of breast cancer , she is at slight risk for bone demineralization.  I counseled her that Tamoxifen will have a protective effect on her bones.  She was given education on specific activities to promote bone health.  3. Cancer screening:  Due to Ms. Ferrelli's history and her age, she should receive screening for skin cancers, colon cancer (at age 22), and gynecologic cancers.  The information and recommendations are listed on the patient's comprehensive care plan/treatment summary and were reviewed in detail with the patient.    4. Health maintenance and wellness promotion: Ms. Thoreson was encouraged  to consume 5-7 servings of fruits and vegetables per day. We reviewed the "Nutrition Rainbow" handout, as well as the handout "Take Control of Your Health and Reduce Your Cancer Risk" from the [facility].  She was also encouraged to engage in moderate to vigorous exercise for 30 minutes per day most days of the week. We discussed the [employer] fitness program, which is designed for cancer survivors to help them become more physically fit after cancer treatments.  She was instructed to limit her alcohol consumption and continue to abstain from tobacco use.     5. Support services/counseling: It is not uncommon for this period of the patient's cancer care trajectory to be one of many emotions and stressors.  We discussed an opportunity for her to participate in the next session of [facility] ("Finding Your New Normal") support group series designed for patients after they have completed treatment.   Ms. Hazelett was encouraged to take advantage of our many other support services programs, support groups, and/or counseling in coping with her new life as a cancer survivor after completing  anti-cancer treatment.  She was offered support today through active listening and expressive supportive counseling.  She was given information regarding our available services and encouraged to contact me with any questions or for help enrolling in any of our support group/programs.    Dispo:   -Return to cancer center in 6 months for follow up with Dr. Lindi Adie  -Mammogram due in 03/2018 -Mammogram and MRI in 03/2019 -Follow up with surgery in 11/2017 and then 10/2018 -She is welcome to return back to the Survivorship Clinic at any time; no additional follow-up needed at this time.  -Consider referral back to survivorship as a long-term survivor for continued surveillance  A total of (30) minutes of face-to-face time was spent with this patient with greater than 50% of that time in counseling and care-coordination.   Gardenia Phlegm, NP Survivorship Program [facility] [phone]   Note: PRIMARY CARE PROVIDER Fanny Bien, [place] [phone]

## 2017-10-05 NOTE — Telephone Encounter (Signed)
This nurse spoke with patient about recurrence risk per VG.  Also told pt new SCP certificate was being mailed out to her.  She voiced understanding at this time.

## 2017-10-17 ENCOUNTER — Other Ambulatory Visit: Payer: Self-pay

## 2017-10-17 MED ORDER — TAMOXIFEN CITRATE 20 MG PO TABS: 20.0000 mg | ORAL_TABLET | Freq: Every day | ORAL | 3 refills | Status: DC

## 2017-10-18 DIAGNOSIS — Z86000 Personal history of in-situ neoplasm of breast: Secondary | ICD-10-CM | POA: Insufficient documentation

## 2017-10-23 ENCOUNTER — Telehealth: Payer: Self-pay

## 2017-10-23 NOTE — Telephone Encounter (Signed)
Patient called in to ask about about her period.  She had one on 12-29 then another 2 weeks later 1-17 that was heavy for 3 days.  Patient asked for advice.  LPN spoke with NP and gave direction to remind pt that  Tamoxifen thickens uterine lining and may cause heavy bleeding.  Once is ok but to check with her GYN first.  If issues continue patient knows to contact center with further concerns or issues.

## 2017-11-09 ENCOUNTER — Other Ambulatory Visit: Payer: Self-pay | Admitting: Adult Health

## 2017-11-09 DIAGNOSIS — Z1231 Encounter for screening mammogram for malignant neoplasm of breast: Secondary | ICD-10-CM

## 2017-11-13 ENCOUNTER — Telehealth: Payer: Self-pay

## 2017-11-13 NOTE — Telephone Encounter (Signed)
Returned pt call and informed her pr Dr. Lindi Adie she can continue to take tamoxifen while having uterine polyp procedure. She asked about a progesterone IUD but I explained to her we do not recommend this since her cancer was PR positive. Informed her if uterine polyps were to recur tamoxifen may need to be reconsidered but as of now she is good to continue taking.  Cyndia Bent RN

## 2017-11-23 ENCOUNTER — Encounter (HOSPITAL_COMMUNITY): Payer: Self-pay | Admitting: *Deleted

## 2017-11-30 ENCOUNTER — Encounter (HOSPITAL_COMMUNITY): Payer: Self-pay

## 2017-11-30 ENCOUNTER — Ambulatory Visit (HOSPITAL_COMMUNITY): Payer: 59 | Admitting: Anesthesiology

## 2017-11-30 ENCOUNTER — Encounter (HOSPITAL_COMMUNITY)
Admission: RE | Disposition: A | Discharge status: Home / Self Care | Payer: Self-pay | Source: Ambulatory Visit | Attending: Obstetrics and Gynecology

## 2017-11-30 ENCOUNTER — Ambulatory Visit (HOSPITAL_COMMUNITY)
Admission: RE | Admit: 2017-11-30 | Discharge: 2017-11-30 | Disposition: A | Discharge status: Home / Self Care | Payer: 59 | Source: Ambulatory Visit | Attending: Obstetrics and Gynecology | Admitting: Obstetrics and Gynecology

## 2017-11-30 ENCOUNTER — Other Ambulatory Visit: Payer: Self-pay

## 2017-11-30 DIAGNOSIS — Z79810 Long term (current) use of selective estrogen receptor modulators (SERMs): Secondary | ICD-10-CM | POA: Diagnosis not present

## 2017-11-30 DIAGNOSIS — N840 Polyp of corpus uteri: Secondary | ICD-10-CM | POA: Diagnosis not present

## 2017-11-30 DIAGNOSIS — J45909 Unspecified asthma, uncomplicated: Secondary | ICD-10-CM | POA: Insufficient documentation

## 2017-11-30 DIAGNOSIS — Z853 Personal history of malignant neoplasm of breast: Secondary | ICD-10-CM | POA: Insufficient documentation

## 2017-11-30 DIAGNOSIS — N938 Other specified abnormal uterine and vaginal bleeding: Secondary | ICD-10-CM | POA: Diagnosis not present

## 2017-11-30 DIAGNOSIS — Z881 Allergy status to other antibiotic agents status: Secondary | ICD-10-CM | POA: Diagnosis not present

## 2017-11-30 HISTORY — PX: HYSTEROSCOPY: SHX211

## 2017-11-30 HISTORY — DX: Pneumonia, unspecified organism: J18.9

## 2017-11-30 LAB — PREGNANCY, URINE: PREG TEST UR: NEGATIVE

## 2017-11-30 LAB — CBC
HCT: 37.9 % (ref 36.0–46.0)
Hemoglobin: 12.5 g/dL (ref 12.0–15.0)
MCH: 29.9 pg (ref 26.0–34.0)
MCHC: 33 g/dL (ref 30.0–36.0)
MCV: 90.7 fL (ref 78.0–100.0)
PLATELETS: 173 10*3/uL (ref 150–400)
RBC: 4.18 MIL/uL (ref 3.87–5.11)
RDW: 13.3 % (ref 11.5–15.5)
WBC: 6.6 10*3/uL (ref 4.0–10.5)

## 2017-11-30 SURGERY — HYSTEROSCOPY
Anesthesia: General

## 2017-11-30 MED ORDER — IBUPROFEN 600 MG PO TABS: 600.0000 mg | ORAL_TABLET | Freq: Four times a day (QID) | ORAL | 0 refills | Status: DC | PRN

## 2017-11-30 MED ORDER — ONDANSETRON HCL 4 MG/2ML IJ SOLN
INTRAMUSCULAR | Status: DC | PRN
  Administered 2017-11-30: 4 mg via INTRAVENOUS

## 2017-11-30 MED ORDER — KETOROLAC TROMETHAMINE 30 MG/ML IJ SOLN
INTRAMUSCULAR | Status: DC | PRN
  Administered 2017-11-30: 30 mg via INTRAVENOUS

## 2017-11-30 MED ORDER — LIDOCAINE HCL 1 % IJ SOLN
INTRAMUSCULAR | Status: AC
  Filled 2017-11-30: qty 20

## 2017-11-30 MED ORDER — MIDAZOLAM HCL 2 MG/2ML IJ SOLN
INTRAMUSCULAR | Status: AC
  Filled 2017-11-30: qty 2

## 2017-11-30 MED ORDER — LIDOCAINE HCL (CARDIAC) 20 MG/ML IV SOLN
INTRAVENOUS | Status: AC
  Filled 2017-11-30: qty 5

## 2017-11-30 MED ORDER — ONDANSETRON HCL 4 MG/2ML IJ SOLN
INTRAMUSCULAR | Status: AC
  Filled 2017-11-30: qty 2

## 2017-11-30 MED ORDER — LIDOCAINE HCL (CARDIAC) 20 MG/ML IV SOLN
INTRAVENOUS | Status: DC | PRN
  Administered 2017-11-30: 100 mg via INTRAVENOUS

## 2017-11-30 MED ORDER — DEXAMETHASONE SODIUM PHOSPHATE 10 MG/ML IJ SOLN
INTRAMUSCULAR | Status: AC
  Filled 2017-11-30: qty 1

## 2017-11-30 MED ORDER — PROPOFOL 10 MG/ML IV BOLUS
INTRAVENOUS | Status: AC
  Filled 2017-11-30: qty 40

## 2017-11-30 MED ORDER — PROMETHAZINE HCL 25 MG/ML IJ SOLN: 6.2500 mg | INTRAMUSCULAR | Status: DC | PRN

## 2017-11-30 MED ORDER — FENTANYL CITRATE (PF) 100 MCG/2ML IJ SOLN
INTRAMUSCULAR | Status: AC
  Filled 2017-11-30: qty 2

## 2017-11-30 MED ORDER — KETOROLAC TROMETHAMINE 30 MG/ML IJ SOLN
INTRAMUSCULAR | Status: AC
  Filled 2017-11-30: qty 1

## 2017-11-30 MED ORDER — HYDROCODONE-ACETAMINOPHEN 5-325 MG PO TABS: ORAL_TABLET | ORAL | 0 refills | Status: DC

## 2017-11-30 MED ORDER — FENTANYL CITRATE (PF) 250 MCG/5ML IJ SOLN
INTRAMUSCULAR | Status: DC | PRN
  Administered 2017-11-30 (×2): 50 ug via INTRAVENOUS

## 2017-11-30 MED ORDER — HYDROCODONE-ACETAMINOPHEN 5-325 MG PO TABS
1.0000 | ORAL_TABLET | Freq: Once | ORAL | Status: AC
  Administered 2017-11-30: 1 via ORAL

## 2017-11-30 MED ORDER — DEXAMETHASONE SODIUM PHOSPHATE 4 MG/ML IJ SOLN
INTRAMUSCULAR | Status: DC | PRN
  Administered 2017-11-30: 10 mg via INTRAVENOUS

## 2017-11-30 MED ORDER — GLYCOPYRROLATE 0.2 MG/ML IJ SOLN
INTRAMUSCULAR | Status: DC | PRN
  Administered 2017-11-30: 0.1 mg via INTRAVENOUS

## 2017-11-30 MED ORDER — HYDROCODONE-ACETAMINOPHEN 5-325 MG PO TABS
ORAL_TABLET | ORAL | Status: AC
  Filled 2017-11-30: qty 1

## 2017-11-30 MED ORDER — FENTANYL CITRATE (PF) 100 MCG/2ML IJ SOLN: 25.0000 ug | INTRAMUSCULAR | Status: DC | PRN

## 2017-11-30 MED ORDER — LACTATED RINGERS IV SOLN
INTRAVENOUS | Status: DC
  Administered 2017-11-30: 12:00:00 via INTRAVENOUS

## 2017-11-30 MED ORDER — SCOPOLAMINE 1 MG/3DAYS TD PT72
MEDICATED_PATCH | TRANSDERMAL | Status: AC
  Administered 2017-11-30: 1.5 mg via TRANSDERMAL
  Filled 2017-11-30: qty 1

## 2017-11-30 MED ORDER — MIDAZOLAM HCL 2 MG/2ML IJ SOLN
INTRAMUSCULAR | Status: DC | PRN
  Administered 2017-11-30: 1 mg via INTRAVENOUS

## 2017-11-30 MED ORDER — GLYCOPYRROLATE 0.2 MG/ML IJ SOLN
INTRAMUSCULAR | Status: AC
  Filled 2017-11-30: qty 1

## 2017-11-30 MED ORDER — SCOPOLAMINE 1 MG/3DAYS TD PT72
1.0000 | MEDICATED_PATCH | Freq: Once | TRANSDERMAL | Status: DC
  Administered 2017-11-30: 1.5 mg via TRANSDERMAL

## 2017-11-30 MED ORDER — PROPOFOL 10 MG/ML IV BOLUS
INTRAVENOUS | Status: DC | PRN
  Administered 2017-11-30: 200 mg via INTRAVENOUS

## 2017-11-30 MED ORDER — SCOPOLAMINE 1 MG/3DAYS TD PT72: 1.0000 | MEDICATED_PATCH | TRANSDERMAL | Status: DC

## 2017-11-30 SURGICAL SUPPLY — 15 items
ABLATOR ENDOMETRIAL BIPOLAR (ABLATOR) IMPLANT
CANISTER SUCT 3000ML PPV (MISCELLANEOUS) ×2 IMPLANT
CATH ROBINSON RED A/P 16FR (CATHETERS) ×2 IMPLANT
DILATOR CANAL MILEX (MISCELLANEOUS) IMPLANT
ELECT REM PT RETURN 9FT ADLT (ELECTROSURGICAL)
ELECTRODE REM PT RTRN 9FT ADLT (ELECTROSURGICAL) IMPLANT
GLOVE BIO SURGEON STRL SZ7 (GLOVE) ×2 IMPLANT
GLOVE BIOGEL PI IND STRL 7.0 (GLOVE) ×1 IMPLANT
GLOVE BIOGEL PI INDICATOR 7.0 (GLOVE) ×1
GOWN STRL REUS W/TWL LRG LVL3 (GOWN DISPOSABLE) ×4 IMPLANT
PACK VAGINAL MINOR WOMEN LF (CUSTOM PROCEDURE TRAY) ×2 IMPLANT
PAD OB MATERNITY 4.3X12.25 (PERSONAL CARE ITEMS) ×2 IMPLANT
TOWEL OR 17X24 6PK STRL BLUE (TOWEL DISPOSABLE) ×4 IMPLANT
TUBING AQUILEX INFLOW (TUBING) ×2 IMPLANT
TUBING AQUILEX OUTFLOW (TUBING) ×2 IMPLANT

## 2017-11-30 NOTE — Anesthesia Procedure Notes (Signed)
Procedure Name: LMA Insertion Date/Time: 11/30/2017 12:33 PM Performed by: Flossie Dibble, CRNA Pre-anesthesia Checklist: Patient identified, Patient being monitored, Emergency Drugs available, Timeout performed and Suction available Patient Re-evaluated:Patient Re-evaluated prior to induction Oxygen Delivery Method: Circle System Utilized Preoxygenation: Pre-oxygenation with 100% oxygen Induction Type: IV induction Ventilation: Mask ventilation without difficulty LMA: LMA inserted LMA Size: 4.0 Number of attempts: 1 Placement Confirmation: positive ETCO2 and breath sounds checked- equal and bilateral Tube secured with: Tape Dental Injury: Teeth and Oropharynx as per pre-operative assessment

## 2017-11-30 NOTE — Op Note (Signed)
Pre-Operative Diagnosis: 1) Dysfunctional uterine bleeding 2) tamoxifen therapy 3) Breast cancer 4) Suspected endometrial polyp Postoperative Diagnosis: 1) Dysfunctional uterine bleeding 2) tamoxifen therapy 3) Breast cancer 4) Globally thickened endometrium Procedure: Hysteroscopy, dilation and curettage Surgeon: Dr. Vanessa Kick Assistant: none Operative Findings: Endometrial cavity with globally thickened endometrium. Fluid deficit 100 cc Specimen: endometrial curettings EBL: Total I/O In: -  Out: 65 [Urine:40; Blood:15]   [name] Is a 42 year old who presents for definitive surgical management for Dysfunctional uterine bleeding in the context of tamoxifen therapy. Please see the patient's history and physical for complete details of the history. Management options were discussed with the patient. R/B/A reviewed. Following appropriate informed consent was taken to the operating room. The patient was appropriately identified during a time out procedure. General anesthesia was administered and the patient was placed in the dorsal lithotomy position. The patient was prepped and draped in the normal sterile fashion. A speculum was placed into the vagina, a single-tooth tenaculum was placed on the anterior lip of the cervix, and 10 cc of 1% lidocaine was administered in a paracervical fashion. The cervix was serially dilated with Hank dilators. The hysteroscope was introduced for the above findings. The hysteroscope was removed and a sharp curettage was performed until a gritty texture was obtained. The Hysteroscope was reintroduced and showed adequate resection of the endometrium. This completed the procedure and the patient was brought to the recovery room in stable condition.

## 2017-11-30 NOTE — Transfer of Care (Signed)
Immediate Anesthesia Transfer of Care Note  Patient: Meagan [name]  Procedure(s) Performed: HYSTEROSCOPY (N/A )  Patient Location: PACU  Anesthesia Type:General  Level of Consciousness: awake, alert  and oriented  Airway & Oxygen Therapy: Patient Spontanous Breathing and Patient connected to nasal cannula oxygen  Post-op Assessment: Report given to RN and Post -op Vital signs reviewed and stable  Post vital signs: Reviewed and stable  Last Vitals:  Vitals:   11/30/17 1106  BP: 118/76  Pulse: (!) 56  Resp: 18  Temp: 36.7 C  SpO2: 100%    Last Pain:  Vitals:   11/30/17 1106  TempSrc: Oral      Patients Stated Pain Goal: 4 (72/25/75 0518)  Complications: No apparent anesthesia complications

## 2017-11-30 NOTE — Anesthesia Preprocedure Evaluation (Addendum)
Anesthesia Evaluation  Patient identified by MRN, date of birth, ID band Patient awake    Reviewed: Allergy & Precautions, NPO status , Patient's Chart, lab work & pertinent test results  History of Anesthesia Complications Negative for: history of anesthetic complications  Airway Mallampati: I  TM Distance: >3 FB Neck ROM: Full    Dental no notable dental hx. (+) Dental Advisory Given   Pulmonary asthma ,    Pulmonary exam normal        Cardiovascular negative cardio ROS Normal cardiovascular exam     Neuro/Psych negative neurological ROS     GI/Hepatic negative GI ROS, Neg liver ROS,   Endo/Other  negative endocrine ROS  Renal/GU negative Renal ROS     Musculoskeletal negative musculoskeletal ROS (+)   Abdominal   Peds  Hematology negative hematology ROS (+)   Anesthesia Other Findings Day of surgery medications reviewed with the patient.  Reproductive/Obstetrics                            Anesthesia Physical  Anesthesia Plan  ASA: II  Anesthesia Plan: General   Post-op Pain Management:    Induction: Intravenous  PONV Risk Score and Plan: 4 or greater and Ondansetron, Dexamethasone, Scopolamine patch - Pre-op and Diphenhydramine  Airway Management Planned: LMA  Additional Equipment:   Intra-op Plan:   Post-operative Plan: Extubation in OR  Informed Consent: I have reviewed the patients History and Physical, chart, labs and discussed the procedure including the risks, benefits and alternatives for the proposed anesthesia with the patient or authorized representative who has indicated his/her understanding and acceptance.   Dental advisory given  Plan Discussed with: Anesthesiologist and CRNA  Anesthesia Plan Comments:        Anesthesia Quick Evaluation

## 2017-11-30 NOTE — H&P (Signed)
Meagan [name] is an 42 y.o. female.   42 yo presents for surgical evaluation for irregular bleeding in the context of tamoxifen therapy. The ultrasound showed a thickened endometrium with suspected polyp  Patient's last menstrual period was 11/14/2017 (exact date).    Past Medical History:  Diagnosis Date  . Asthma    childhood astha, no problems now  . Cancer ([place]) 05/2017   left breast DCIS  . Family history of breast cancer   . Family history of colon cancer   . Family history of melanoma   . Pneumonia    as a child    Past Surgical History:  Procedure Laterality Date  . APPENDECTOMY  10/02/2001  . BREAST ENHANCEMENT SURGERY Right 10/01/2017   Procedure: RIGHT AUGMENTATION FOR SYMMETRY;  Surgeon: Irene Limbo, MD;  Location: [place];  Service: Plastics;  Laterality: Right;  . BREAST RECONSTRUCTION Left 10/01/2017   Procedure: NIPPLE SHARING RECONSTRUCTION LEFT CHEST;  Surgeon: Irene Limbo, MD;  Location: [place];  Service: Plastics;  Laterality: Left;  . BREAST RECONSTRUCTION WITH PLACEMENT OF TISSUE EXPANDER AND FLEX HD (ACELLULAR HYDRATED DERMIS) Left 06/12/2017   Procedure: LEFT BREAST RECONSTRUCTION WITH PLACEMENT OF TISSUE EXPANDERAND ALLODERM;  Surgeon: Irene Limbo, MD;  Location: [place];  Service: Plastics;  Laterality: Left;  . EXCISION OF BREAST LESION Left 07/06/2017   Procedure: EXCISION OF LEFT BREAST NIPPLE;  Surgeon: Coralie Keens, MD;  Location: [place];  Service: General;  Laterality: Left;  . NIPPLE SPARING MASTECTOMY Left 06/12/2017   Procedure: LEFT BREAST NIPPLE SPARING MASTECTOMY;  Surgeon: Coralie Keens, MD;  Location: [place];  Service: General;  Laterality: Left;  . REMOVAL OF TISSUE EXPANDER AND PLACEMENT OF IMPLANT Left 07/17/2017   Procedure: REMOVAL OF TISSUE EXPANDER AND PLACEMENT OF IMPLANT;  Surgeon: Irene Limbo, MD;  Location: [place];  Service: Plastics;  Laterality: Left;  . REMOVAL OF TISSUE EXPANDER AND PLACEMENT OF IMPLANT Left 10/01/2017   Procedure: REMOVAL OF TISSUE EXPANDER AND PLACEMENT OF LEFT  IMPLANT;  Surgeon: Irene Limbo, MD;  Location: [place];  Service: Plastics;  Laterality: Left;  . WISDOM TOOTH EXTRACTION      Family History  Problem Relation Age of Onset  . Cervical cancer Mother 34       adenocarcioma  . Skin cancer Mother        BCC's  . Melanoma Father        dx in his 45s  . Parkinson's disease Maternal Grandmother   . Colon cancer Maternal Grandfather        dx in his 55s  . Skin cancer Paternal Grandfather   . Breast cancer Cousin        mother's maternal cousin - x2, one dx at 34, another in her 68s  . Breast cancer Cousin        mothers paternal first cousin dx in her 78s-60s, and again in her 11s with possible DCIS    Social History:  reports that  has never smoked. she has never used smokeless tobacco. She reports that she drinks alcohol. She reports that she does not use drugs.  Allergies:  Allergies  Allergen Reactions  . Erythromycin Nausea And Vomiting    Medications Prior to Admission  Medication Sig Dispense Refill Last Dose  . ibuprofen (ADVIL,MOTRIN) 200 MG tablet Take 400 mg by mouth every 8 (eight) hours as needed for headache or mild pain.  Past Month at Unknown time  . Multiple Vitamin (MULTIVITAMIN WITH MINERALS) TABS tablet Take 1 tablet by mouth daily.   Past Month at Unknown time  . tamoxifen (NOLVADEX) 20 MG tablet Take 1 tablet (20 mg total) by mouth daily. 90 tablet 3 11/29/2017 at Unknown time  . methocarbamol (ROBAXIN) 500 MG tablet Take 1 tablet (500 mg total) by mouth every 8 (eight) hours as needed for muscle spasms. (Patient not taking: Reported on 11/20/2017) 30 tablet 0 Not Taking at Unknown time  . oxyCODONE (OXY IR/ROXICODONE) 5 MG immediate release tablet Take 1-2 tablets (5-10 mg total) by mouth every 4  (four) hours as needed for moderate pain. (Patient not taking: Reported on 11/20/2017) 30 tablet 0 Not Taking at Unknown time  . sulfamethoxazole-trimethoprim (BACTRIM DS,SEPTRA DS) 800-160 MG tablet Take 1 tablet by mouth 2 (two) times daily. (Patient not taking: Reported on 11/20/2017) 14 tablet 0 Not Taking at Unknown time    ROS  Blood pressure 118/76, pulse (!) 56, temperature 98.1 F (36.7 C), temperature source Oral, resp. rate 18, height 5\' 6"  (1.676 m), weight 59 kg (130 lb), last menstrual period 11/14/2017, SpO2 100 %. Physical Exam   AOx3, NAD Normal work of breathing Abd soft  Results for orders placed or performed during the hospital encounter of 11/30/17 (from the past 24 hour(s))  CBC     Status: None   Collection Time: 11/30/17 11:00 AM  Result Value Ref Range   WBC 6.6 4.0 - 10.5 K/uL   RBC 4.18 3.87 - 5.11 MIL/uL   Hemoglobin 12.5 12.0 - 15.0 g/dL   HCT 37.9 36.0 - 46.0 %   MCV 90.7 78.0 - 100.0 fL   MCH 29.9 26.0 - 34.0 pg   MCHC 33.0 30.0 - 36.0 g/dL   RDW 13.3 11.5 - 15.5 %   Platelets 173 150 - 400 K/uL  Pregnancy, urine     Status: None   Collection Time: 11/30/17 11:50 AM  Result Value Ref Range   Preg Test, Ur NEGATIVE NEGATIVE    No results found.  Assessment/Plan: 1) Admit 2) Hysteroscopy, dilation & curettage 3) SCDs   Vanessa Kick 11/30/2017, 12:09 PM

## 2017-11-30 NOTE — Anesthesia Postprocedure Evaluation (Signed)
Anesthesia Post Note  Patient: Meagan [name]  Procedure(s) Performed: HYSTEROSCOPY (N/A )     Patient location during evaluation: PACU Anesthesia Type: General Level of consciousness: sedated Pain management: pain level controlled Vital Signs Assessment: post-procedure vital signs reviewed and stable Respiratory status: spontaneous breathing and respiratory function stable Cardiovascular status: stable Postop Assessment: no apparent nausea or vomiting Anesthetic complications: no    Last Vitals:  Vitals:   11/30/17 1400 11/30/17 1412  BP: 111/64 109/73  Pulse: (!) 55 (!) 56  Resp: 18 14  Temp:    SpO2: 100% 98%    Last Pain:  Vitals:   11/30/17 1412  TempSrc:   PainSc: 2    Pain Goal: Patients Stated Pain Goal: 4 (11/30/17 1106)               [name]

## 2017-11-30 NOTE — Discharge Instructions (Signed)
DISCHARGE INSTRUCTIONS: HYSTEROSCOPY / ENDOMETRIAL ABLATION °The following instructions have been prepared to help you care for yourself upon your return home. ° °May Remove Scop patch on or before ° °May take Ibuprofen after ° °May take stool softner while taking narcotic pain medication to prevent constipation.  Drink plenty of water. ° °Personal hygiene: °• Use sanitary pads for vaginal drainage, not tampons. °• Shower the day after your procedure. °• NO tub baths, pools or Jacuzzis for 2-3 weeks. °• Wipe front to back after using the bathroom. ° °Activity and limitations: °• Do NOT drive or operate any equipment for 24 hours. The effects of anesthesia are still present °and drowsiness may result. °• Do NOT rest in bed all day. °• Walking is encouraged. °• Walk up and down stairs slowly. °• You may resume your normal activity in one to two days or as indicated by your physician. °Sexual activity: NO intercourse for at least 2 weeks after the procedure, or as indicated by your °Doctor. ° °Diet: Eat a light meal as desired this evening. You may resume your usual diet tomorrow. ° °Return to Work: You may resume your work activities in one to two days or as indicated by your °Doctor. ° °What to expect after your surgery: Expect to have vaginal bleeding/discharge for 2-3 days and °spotting for up to 10 days. It is not unusual to have soreness for up to 1-2 weeks. You may have a °slight burning sensation when you urinate for the first day. Mild cramps may continue for a couple of °days. You may have a regular period in 2-6 weeks. ° °Call your doctor for any of the following: °• Excessive vaginal bleeding or clotting, saturating and changing one pad every hour. °• Inability to urinate 6 hours after discharge from hospital. °• Pain not relieved by pain medication. °• Fever of 100.4° F or greater. °• Unusual vaginal discharge or odor. ° ° °Post Anesthesia Home Care Instructions ° °Activity: °Get plenty of rest for the  remainder of the day. A responsible individual must stay with you for 24 hours following the procedure.  °For the next 24 hours, DO NOT: °-Drive a car °-Operate machinery °-Drink alcoholic beverages °-Take any medication unless instructed by your physician °-Make any legal decisions or sign important papers. ° °Meals: °Start with liquid foods such as gelatin or soup. Progress to regular foods as tolerated. Avoid greasy, spicy, heavy foods. If nausea and/or vomiting occur, drink only clear liquids until the nausea and/or vomiting subsides. Call your physician if vomiting continues. ° °Special Instructions/Symptoms: °Your throat may feel dry or sore from the anesthesia or the breathing tube placed in your throat during surgery. If this causes discomfort, gargle with warm salt water. The discomfort should disappear within 24 hours. ° °If you had a scopolamine patch placed behind your ear for the management of post- operative nausea and/or vomiting: ° °1. The medication in the patch is effective for 72 hours, after which it should be removed.  Wrap patch in a tissue and discard in the trash. Wash hands thoroughly with soap and water. °2. You may remove the patch earlier than 72 hours if you experience unpleasant side effects which may include dry mouth, dizziness or visual disturbances. °3. Avoid touching the patch. Wash your hands with soap and water after contact with the patch. °  ° °

## 2017-12-01 ENCOUNTER — Encounter (HOSPITAL_COMMUNITY): Payer: Self-pay | Admitting: Obstetrics and Gynecology

## 2017-12-03 NOTE — Addendum Note (Signed)
Addendum  created 12/03/17 1002 by Lucretia Kern D, CRNA   Charge Capture section accepted

## 2018-03-29 ENCOUNTER — Telehealth: Payer: Self-pay | Admitting: Hematology and Oncology

## 2018-03-29 ENCOUNTER — Inpatient Hospital Stay: Payer: 59 | Attending: Hematology and Oncology | Admitting: Hematology and Oncology

## 2018-03-29 DIAGNOSIS — Z1231 Encounter for screening mammogram for malignant neoplasm of breast: Secondary | ICD-10-CM

## 2018-03-29 DIAGNOSIS — Z170 Estrogen receptor positive status [ER+]: Secondary | ICD-10-CM | POA: Insufficient documentation

## 2018-03-29 DIAGNOSIS — D0512 Intraductal carcinoma in situ of left breast: Secondary | ICD-10-CM

## 2018-03-29 MED ORDER — TAMOXIFEN CITRATE 20 MG PO TABS: 20.0000 mg | ORAL_TABLET | Freq: Every day | ORAL | 3 refills | Status: DC

## 2018-03-29 NOTE — Progress Notes (Signed)
Patient Care Team: Fanny Bien, MD as PCP - General (Family Medicine) Nicholas Lose, MD as Consulting Physician (Hematology and Oncology) Delice Bison, Charlestine Massed, NP as Nurse Practitioner (Hematology and Oncology) Irene Limbo, MD as Consulting Physician (Plastic Surgery) Coralie Keens, MD as Consulting Physician (General Surgery)  DIAGNOSIS:  Encounter Diagnoses  Name Primary?  . Ductal carcinoma in situ (DCIS) of left breast   . Encounter for screening mammogram for malignant neoplasm of breast     SUMMARY OF ONCOLOGIC HISTORY:   Ductal carcinoma in situ (DCIS) of left breast   04/10/2017 Initial Diagnosis    Left breast biopsy 12:00 and 12:30 position: DCIS, grade 1; screening mammogram detected a 9 mm and a 5 mm abnormality in the left breast      04/21/2017 Genetic Testing    BRCA2 c.5937C>G VUS identified on the STAT panel.  The STAT Breast cancer panel offered by [facility] includes sequencing and rearrangement analysis for the following 9 genes:  ATM, BRCA1, BRCA2, CDH1, CHEK2, PALB2, PTEN, STK11 and TP53.   The report date is April 21, 2017.    Testing was reflexed to the larger common hereditary cancer panel and the melanoma cancer panel. This testing was NEGATIVE.  The Hereditary Gene Panel offered by [facility] includes sequencing and/or deletion duplication testing of the following 46 genes: APC, ATM, AXIN2, BARD1, BMPR1A, BRCA1, BRCA2, BRIP1, CDH1, CDKN2A (p14ARF), CDKN2A (p16INK4a), CHEK2, CTNNA1, DICER1, EPCAM (Deletion/duplication testing only), GREM1 (promoter region deletion/duplication testing only), KIT, MEN1, MLH1, MSH2, MSH3, MSH6, MUTYH, NBN, NF1, NHTL1, PALB2, PDGFRA, PMS2, POLD1, POLE, PTEN, RAD50, RAD51C, RAD51D, SDHB, SDHC, SDHD, SMAD4, SMARCA4. STK11, TP53, TSC1, TSC2, and VHL.  The following genes were evaluated for sequence changes only: SDHA and HOXB13 c.251G>A variant only.  The Melanoma panel offered by [facility] includes sequencing and/or deletion  duplication testing of the following 12 genes: BAP1, BRCA1, BRCA2, BRIP1, CDK4, CDKN2A (p14ARF), CDKN2A (p16INK4a), MC1R, POT1, PTEN, RB1, TERT, and TP53.  The following gene was evaluated for sequence changes only: MITF (c.952G>A, p.GLU318Lys variant only).   The report date is April 29, 2017.        06/12/2017 Surgery    Left Mastectomy: Low-grade DCIS 2.7 cm, margins negative, ER + PR +, Tis Nx stage 0      07/2017 -  Anti-estrogen oral therapy    Tamoxifen daily       CHIEF COMPLIANT: Follow-up on tamoxifen therapy  INTERVAL HISTORY: Meagan [name] is a 42 year old with above-mentioned history of left breast DCIS who underwent mastectomy and is currently on tamoxifen therapy.  She appears to be tolerating tamoxifen extremely well.  She denies any lumps or nodules in the chest wall or axilla.  Mammogram on the right breast will be done in July.  REVIEW OF SYSTEMS:   Constitutional: Denies fevers, chills or abnormal weight loss Eyes: Denies blurriness of vision Ears, nose, mouth, throat, and face: Denies mucositis or sore throat Respiratory: Denies cough, dyspnea or wheezes Cardiovascular: Denies palpitation, chest discomfort Gastrointestinal:  Denies nausea, heartburn or change in bowel habits Skin: Denies abnormal skin rashes Lymphatics: Denies new lymphadenopathy or easy bruising Neurological:Denies numbness, tingling or new weaknesses Behavioral/Psych: Mood is stable, no new changes  Extremities: No lower extremity edema Breast: Denies any lumps or nodules in the chest wall or axilla All other systems were reviewed with the patient and are negative.  I have reviewed the past medical history, past surgical history, social history and family history with the patient and they are  unchanged from previous note.  ALLERGIES:  is allergic to erythromycin.  MEDICATIONS:  Current Outpatient Medications  Medication Sig Dispense Refill  . tamoxifen (NOLVADEX) 20 MG tablet Take 1 tablet  (20 mg total) by mouth daily. 90 tablet 3   No current facility-administered medications for this visit.     PHYSICAL EXAMINATION: ECOG PERFORMANCE STATUS: 1 - Symptomatic but completely ambulatory  Vitals:   03/29/18 0952  BP: 131/75  Pulse: (!) 58  Resp: 18  Temp: 98.4 F (36.9 C)  SpO2: 100%   Filed Weights   03/29/18 0952  Weight: 134 lb 3.2 oz (60.9 kg)    GENERAL:alert, no distress and comfortable SKIN: skin color, texture, turgor are normal, no rashes or significant lesions EYES: normal, Conjunctiva are pink and non-injected, sclera clear OROPHARYNX:no exudate, no erythema and lips, buccal mucosa, and tongue normal  NECK: supple, thyroid normal size, non-tender, without nodularity LYMPH:  no palpable lymphadenopathy in the cervical, axillary or inguinal LUNGS: clear to auscultation and percussion with normal breathing effort HEART: regular rate & rhythm and no murmurs and no lower extremity edema ABDOMEN:abdomen soft, non-tender and normal bowel sounds MUSCULOSKELETAL:no cyanosis of digits and no clubbing  NEURO: alert & oriented x 3 with fluent speech, no focal motor/sensory deficits EXTREMITIES: No lower extremity edema BREAST: No palpable lumps or nodules in the chest wall or axilla (exam performed in the presence of a chaperone)  LABORATORY DATA:  I have reviewed the data as listed No flowsheet data found.  Lab Results  Component Value Date   WBC 6.6 11/30/2017   HGB 12.5 11/30/2017   HCT 37.9 11/30/2017   MCV 90.7 11/30/2017   PLT 173 11/30/2017    ASSESSMENT & PLAN:  Ductal carcinoma in situ (DCIS) of left breast 06/12/2017: Left Mastectomy: Low-grade DCIS 2.7 cm, margins negative, ER and a pleasant, PR and a pleasant, Tis Nx stage 0  Current treatment: Tamoxifen 20 mg daily x5 years started 07/02/2017 Tamoxifen toxicities: Denies any hot flashes or myalgias.  Breast cancer surveillance: 1.  Breast exam 03/29/2018: Benign 2. mammogram to be done  04/01/2018 Plan is to obtain breast MRI every couple of 2 yrs.  Next MRI will be obtained in December 2019.  Return to clinic after the breast MRI and follow-up in December.    Orders Placed This Encounter  Procedures  . MR BREAST BILATERAL W WO CONTRAST INC CAD    Standing Status:   Future    Standing Expiration Date:   05/30/2019    Order Specific Question:   If indicated for the ordered procedure, I authorize the administration of contrast media per Radiology protocol    Answer:   Yes    Order Specific Question:   What is the patient's sedation requirement?    Answer:   No Sedation    Order Specific Question:   Does the patient have a pacemaker or implanted devices?    Answer:   Yes    Order Specific Question:   Manufacturer of pacemake or implanted device?    Answer:   Breast implants    Order Specific Question:   Radiology Contrast Protocol - do NOT remove file path    Answer:   [email or web address]    Order Specific Question:   Preferred imaging location?    Answer:   [facility] (table limit-350 lbs)   The patient has a good understanding of the overall plan. she agrees with it. she will call with any problems that may  develop before the next visit here.   Harriette Ohara, MD 03/29/18

## 2018-03-29 NOTE — Telephone Encounter (Signed)
Gave patient avs and calendar of upcoming January appointments.

## 2018-03-29 NOTE — Assessment & Plan Note (Signed)
06/12/2017: Left Mastectomy: Low-grade DCIS 2.7 cm, margins negative, ER and a pleasant, PR and a pleasant, Tis Nx stage 0  Current treatment: Tamoxifen 20 mg daily x5 years started 07/02/2017 Tamoxifen toxicities: Denies any hot flashes or myalgias.  Breast cancer surveillance: 1.  Breast exam 03/29/2018: Benign 2. mammogram to be done 04/01/2018  Return to clinic in 1 year for follow-up

## 2018-04-01 ENCOUNTER — Ambulatory Visit
Admission: RE | Admit: 2018-04-01 | Discharge: 2018-04-01 | Disposition: A | Discharge status: Home / Self Care | Payer: PRIVATE HEALTH INSURANCE | Source: Ambulatory Visit | Attending: Adult Health | Admitting: Adult Health

## 2018-04-01 DIAGNOSIS — Z1231 Encounter for screening mammogram for malignant neoplasm of breast: Secondary | ICD-10-CM

## 2018-08-22 ENCOUNTER — Telehealth: Payer: Self-pay

## 2018-08-22 ENCOUNTER — Encounter: Payer: Self-pay | Admitting: Family Medicine

## 2018-08-22 NOTE — Telephone Encounter (Signed)
Patient left voicemail inquiring about MRI of breast that needs to be scheduled prior to her upcoming appointment in January.    Nurse returned call.  Left voicemail with [facility] Scheduling number, along with my contact if any questions.

## 2018-09-02 DIAGNOSIS — M545 Low back pain, unspecified: Secondary | ICD-10-CM | POA: Insufficient documentation

## 2018-09-22 ENCOUNTER — Other Ambulatory Visit: Payer: Self-pay | Admitting: Hematology and Oncology

## 2018-09-27 ENCOUNTER — Ambulatory Visit (HOSPITAL_COMMUNITY): Payer: 59

## 2018-09-27 ENCOUNTER — Telehealth: Payer: Self-pay | Admitting: Adult Health

## 2018-09-27 NOTE — Telephone Encounter (Signed)
Called and spoke with [employer] for approximately 10 minutes. Was notified that patient MRI was denied because she should have CT chest abdomen and pelvis instead to rule out cancer recurrence on anti estrogen therapy.  I notified them that we were doing the MRI as supplementary to mammogram since her breast density is category D.  They let me know that I will have to do a peer to peer today at [phone] at the earliest.    Time spent on phone 10 minutes.  Wilber Bihari, NP

## 2018-09-27 NOTE — Telephone Encounter (Signed)
Received call from Dr. Lynnette Caffey from [facility] peer to peer.  Received authorization for breast MRI however noted that she would have to call me back in about thirty minutes with authorization number.  She will leave the number on my voice mail once her computer system comes back up.    Wilber Bihari, NP

## 2018-10-01 ENCOUNTER — Ambulatory Visit (HOSPITAL_COMMUNITY)
Admission: RE | Admit: 2018-10-01 | Discharge: 2018-10-01 | Disposition: A | Discharge status: Home / Self Care | Payer: 59 | Source: Ambulatory Visit | Attending: Hematology and Oncology | Admitting: Hematology and Oncology

## 2018-10-01 DIAGNOSIS — Z1231 Encounter for screening mammogram for malignant neoplasm of breast: Secondary | ICD-10-CM | POA: Diagnosis not present

## 2018-10-01 MED ORDER — GADOBUTROL 1 MMOL/ML IV SOLN
6.0000 mL | Freq: Once | INTRAVENOUS | Status: AC | PRN
  Administered 2018-10-01: 6 mL via INTRAVENOUS

## 2018-10-03 NOTE — Progress Notes (Signed)
Patient Care Team: Fanny Bien, MD as PCP - General (Family Medicine) Nicholas Lose, MD as Consulting Physician (Hematology and Oncology) Delice Bison, Charlestine Massed, NP as Nurse Practitioner (Hematology and Oncology) Irene Limbo, MD as Consulting Physician (Plastic Surgery) Coralie Keens, MD as Consulting Physician (General Surgery)  DIAGNOSIS:    ICD-10-CM   1. Ductal carcinoma in situ (DCIS) of left breast D05.12     SUMMARY OF ONCOLOGIC HISTORY:   Ductal carcinoma in situ (DCIS) of left breast   04/10/2017 Initial Diagnosis    Left breast biopsy 12:00 and 12:30 position: DCIS, grade 1; screening mammogram detected a 9 mm and a 5 mm abnormality in the left breast    04/21/2017 Genetic Testing    BRCA2 c.5937C>G VUS identified on the STAT panel.  The STAT Breast cancer panel offered by [facility] includes sequencing and rearrangement analysis for the following 9 genes:  ATM, BRCA1, BRCA2, CDH1, CHEK2, PALB2, PTEN, STK11 and TP53.   The report date is April 21, 2017.    Testing was reflexed to the larger common hereditary cancer panel and the melanoma cancer panel. This testing was NEGATIVE.  The Hereditary Gene Panel offered by [facility] includes sequencing and/or deletion duplication testing of the following 46 genes: APC, ATM, AXIN2, BARD1, BMPR1A, BRCA1, BRCA2, BRIP1, CDH1, CDKN2A (p14ARF), CDKN2A (p16INK4a), CHEK2, CTNNA1, DICER1, EPCAM (Deletion/duplication testing only), GREM1 (promoter region deletion/duplication testing only), KIT, MEN1, MLH1, MSH2, MSH3, MSH6, MUTYH, NBN, NF1, NHTL1, PALB2, PDGFRA, PMS2, POLD1, POLE, PTEN, RAD50, RAD51C, RAD51D, SDHB, SDHC, SDHD, SMAD4, SMARCA4. STK11, TP53, TSC1, TSC2, and VHL.  The following genes were evaluated for sequence changes only: SDHA and HOXB13 c.251G>A variant only.  The Melanoma panel offered by [facility] includes sequencing and/or deletion duplication testing of the following 12 genes: BAP1, BRCA1, BRCA2, BRIP1, CDK4, CDKN2A  (p14ARF), CDKN2A (p16INK4a), MC1R, POT1, PTEN, RB1, TERT, and TP53.  The following gene was evaluated for sequence changes only: MITF (c.952G>A, p.GLU318Lys variant only).   The report date is April 29, 2017.      06/12/2017 Surgery    Left Mastectomy: Low-grade DCIS 2.7 cm, margins negative, ER + PR +, Tis Nx stage 0    07/2017 -  Anti-estrogen oral therapy    Tamoxifen daily     CHIEF COMPLIANT: Follow-up on tamoxifen therapy  INTERVAL HISTORY: Meagan [name] is a 43 y.o. with above-mentioned history of left breast DCIS who underwent a left mastectomy and is currently on tamoxifen therapy. A breast MRI from 10/01/18 showed no evidence of malignancy bilaterally. She presents to the clinic today alone and is doing well. She hurt her back 6 weeks ago and has seen an orthopedic doctor for it, which is slowly improving. She reports SOB when exercising recently. She is Vitamin D deficient and her PCP gave her daily Vitiman D. She reports occasional hot flashes in the middle of the night. Her OBGYN did a breast exam this year. She expressed concern about her genetic testing and asked to have an appointment with genetics. She reviewed her medication list with me.   REVIEW OF SYSTEMS:   Constitutional: Denies fevers, chills or abnormal weight loss (+) occasional hot flashes Eyes: Denies blurriness of vision Ears, nose, mouth, throat, and face: Denies mucositis or sore throat Respiratory: Denies cough or wheezes (+) SOB during exercise Cardiovascular: Denies palpitation, chest discomfort Gastrointestinal:  Denies nausea, heartburn or change in bowel habits Skin: Denies abnormal skin rashes MSK: (+) back pain Lymphatics: Denies new lymphadenopathy or easy bruising Neurological:Denies  numbness, tingling or new weaknesses Behavioral/Psych: Mood is stable, no new changes  Extremities: No lower extremity edema Breast: denies any pain or lumps or nodules in either breasts All other systems were reviewed  with the patient and are negative.  I have reviewed the past medical history, past surgical history, social history and family history with the patient and they are unchanged from previous note.  ALLERGIES:  is allergic to erythromycin.  MEDICATIONS:  Current Outpatient Medications  Medication Sig Dispense Refill  . cholecalciferol (VITAMIN D3) 25 MCG (1000 UT) tablet Take 3 tablets (3,000 Units total) by mouth daily.    . tamoxifen (NOLVADEX) 20 MG tablet TAKE 1 TABLET DAILY 90 tablet 4  . Turmeric 500 MG CAPS Take 1 capsule by mouth daily.     No current facility-administered medications for this visit.     PHYSICAL EXAMINATION: ECOG PERFORMANCE STATUS: 1 - Symptomatic but completely ambulatory  Vitals:   10/04/18 0926  BP: 126/78  Pulse: (!) 59  Resp: 18  Temp: 98.4 F (36.9 C)  SpO2: 100%   Filed Weights   10/04/18 0926  Weight: 136 lb 12.8 oz (62.1 kg)    GENERAL:alert, no distress and comfortable SKIN: skin color, texture, turgor are normal, no rashes or significant lesions EYES: normal, Conjunctiva are pink and non-injected, sclera clear OROPHARYNX:no exudate, no erythema and lips, buccal mucosa, and tongue normal  NECK: supple, thyroid normal size, non-tender, without nodularity LYMPH:  no palpable lymphadenopathy in the cervical, axillary or inguinal LUNGS: clear to auscultation and percussion with normal breathing effort HEART: regular rate & rhythm and no murmurs and no lower extremity edema ABDOMEN:abdomen soft, non-tender and normal bowel sounds MUSCULOSKELETAL:no cyanosis of digits and no clubbing  NEURO: alert & oriented x 3 with fluent speech, no focal motor/sensory deficits EXTREMITIES: No lower extremity edema  LABORATORY DATA:  I have reviewed the data as listed No flowsheet data found.  Lab Results  Component Value Date   WBC 6.6 11/30/2017   HGB 12.5 11/30/2017   HCT 37.9 11/30/2017   MCV 90.7 11/30/2017   PLT 173 11/30/2017     ASSESSMENT & PLAN:  Ductal carcinoma in situ (DCIS) of left breast 06/12/2017:Left Mastectomy: Low-grade DCIS 2.7 cm, margins negative, ER and a pleasant, PR and a pleasant, Tis Nx stage 0  Current treatment: Tamoxifen 20 mg daily x5 years started 07/02/2017 Tamoxifen toxicities: Denies any hot flashes or myalgias.  Breast cancer surveillance: 1. Breast exam done by her gynecologist benign 2. Breast MRI 10/01/2018: No evidence of cancer in the right breast status post left mastectomy.  Bilateral breast implants appear intact.  Recommended screening mammogram in 1 year. Plan is to obtain breast MRI every couple of 2 yrs.  Next MRI will be obtained in December 2021. 3.  Mammogram 04/01/2018: No evidence of malignancy, breast density category D  Return to clinic  in 1 year for follow-up    No orders of the defined types were placed in this encounter.  The patient has a good understanding of the overall plan. she agrees with it. she will call with any problems that may develop before the next visit here.  Nicholas Lose, MD 10/04/2018   I, Molly Dorshimer, am acting as scribe for Nicholas Lose, MD.  I have reviewed the above documentation for accuracy and completeness, and I agree with the above.

## 2018-10-04 ENCOUNTER — Telehealth: Payer: Self-pay | Admitting: Hematology and Oncology

## 2018-10-04 ENCOUNTER — Inpatient Hospital Stay: Payer: 59 | Attending: Hematology and Oncology | Admitting: Hematology and Oncology

## 2018-10-04 DIAGNOSIS — D0512 Intraductal carcinoma in situ of left breast: Secondary | ICD-10-CM | POA: Diagnosis not present

## 2018-10-04 DIAGNOSIS — N951 Menopausal and female climacteric states: Secondary | ICD-10-CM | POA: Insufficient documentation

## 2018-10-04 DIAGNOSIS — Z9012 Acquired absence of left breast and nipple: Secondary | ICD-10-CM | POA: Insufficient documentation

## 2018-10-04 DIAGNOSIS — Z170 Estrogen receptor positive status [ER+]: Secondary | ICD-10-CM | POA: Insufficient documentation

## 2018-10-04 DIAGNOSIS — E559 Vitamin D deficiency, unspecified: Secondary | ICD-10-CM

## 2018-10-04 MED ORDER — VITAMIN D 25 MCG (1000 UNIT) PO TABS: 3000.0000 [IU] | ORAL_TABLET | Freq: Every day | ORAL | Status: AC

## 2018-10-04 MED ORDER — TURMERIC 500 MG PO CAPS: 1.0000 | ORAL_CAPSULE | Freq: Every day | ORAL | Status: AC

## 2018-10-04 NOTE — Assessment & Plan Note (Signed)
06/12/2017:Left Mastectomy: Low-grade DCIS 2.7 cm, margins negative, ER and a pleasant, PR and a pleasant, Tis Nx stage 0  Current treatment: Tamoxifen 20 mg daily x5 years started 07/02/2017 Tamoxifen toxicities: Denies any hot flashes or myalgias.  Breast cancer surveillance: 1. Breast exam 10/04/2018: Benign 2. Breast MRI 10/01/2018: No evidence of cancer in the right breast status post left mastectomy.  Bilateral breast implants appear intact.  Recommended screening mammogram in 1 year. Plan is to obtain breast MRI every couple of 2 yrs.  Next MRI will be obtained in December 2021. 3.  Mammogram 04/01/2018: No evidence of malignancy, breast density category D  Return to clinic  in 1 year for follow-up

## 2018-10-04 NOTE — Telephone Encounter (Signed)
Patient decline avs and calendar °

## 2018-10-07 ENCOUNTER — Encounter: Payer: Self-pay | Admitting: Hematology and Oncology

## 2018-12-26 DIAGNOSIS — M5126 Other intervertebral disc displacement, lumbar region: Secondary | ICD-10-CM | POA: Insufficient documentation

## 2018-12-26 DIAGNOSIS — M5136 Other intervertebral disc degeneration, lumbar region: Secondary | ICD-10-CM | POA: Insufficient documentation

## 2019-01-09 ENCOUNTER — Encounter: Payer: Self-pay | Admitting: Licensed Clinical Social Worker

## 2019-01-09 NOTE — Progress Notes (Signed)
UPDATE: BRCA2 c.5937C>G VUS reclassified to "Likely Benign." The report date is 01/08/2019.

## 2019-03-28 ENCOUNTER — Other Ambulatory Visit: Payer: Self-pay | Admitting: Adult Health

## 2019-03-28 DIAGNOSIS — Z1231 Encounter for screening mammogram for malignant neoplasm of breast: Secondary | ICD-10-CM

## 2019-04-30 ENCOUNTER — Other Ambulatory Visit: Payer: Self-pay | Admitting: Family Medicine

## 2019-04-30 DIAGNOSIS — Z20822 Contact with and (suspected) exposure to covid-19: Secondary | ICD-10-CM

## 2019-05-02 LAB — NOVEL CORONAVIRUS, NAA: SARS-CoV-2, NAA: NOT DETECTED

## 2019-05-08 ENCOUNTER — Ambulatory Visit: Payer: 59

## 2019-05-21 ENCOUNTER — Other Ambulatory Visit: Payer: Self-pay

## 2019-05-21 ENCOUNTER — Other Ambulatory Visit: Payer: Self-pay | Admitting: Family Medicine

## 2019-05-21 ENCOUNTER — Ambulatory Visit
Admission: RE | Admit: 2019-05-21 | Discharge: 2019-05-21 | Disposition: A | Discharge status: Home / Self Care | Payer: 59 | Source: Ambulatory Visit | Attending: Family Medicine | Admitting: Family Medicine

## 2019-05-21 DIAGNOSIS — R0781 Pleurodynia: Secondary | ICD-10-CM

## 2019-05-21 DIAGNOSIS — R0789 Other chest pain: Secondary | ICD-10-CM

## 2019-06-16 ENCOUNTER — Ambulatory Visit
Admission: RE | Admit: 2019-06-16 | Discharge: 2019-06-16 | Disposition: A | Discharge status: Home / Self Care | Payer: 59 | Source: Ambulatory Visit | Attending: Adult Health | Admitting: Adult Health

## 2019-06-16 ENCOUNTER — Other Ambulatory Visit: Payer: Self-pay | Admitting: Adult Health

## 2019-06-16 ENCOUNTER — Other Ambulatory Visit: Payer: Self-pay

## 2019-06-16 DIAGNOSIS — Z1231 Encounter for screening mammogram for malignant neoplasm of breast: Secondary | ICD-10-CM

## 2019-06-16 HISTORY — DX: Malignant neoplasm of unspecified site of unspecified female breast: C50.919

## 2019-10-05 NOTE — Progress Notes (Signed)
Patient Care Team: Fanny Bien, MD as PCP - General (Family Medicine) Nicholas Lose, MD as Consulting Physician (Hematology and Oncology) Delice Bison, Charlestine Massed, NP as Nurse Practitioner (Hematology and Oncology) Irene Limbo, MD as Consulting Physician (Plastic Surgery) Coralie Keens, MD as Consulting Physician (General Surgery)  DIAGNOSIS:    ICD-10-CM   1. Ductal carcinoma in situ (DCIS) of left breast  D05.12     SUMMARY OF ONCOLOGIC HISTORY: Oncology History  Ductal carcinoma in situ (DCIS) of left breast  04/10/2017 Initial Diagnosis   Left breast biopsy 12:00 and 12:30 position: DCIS, grade 1; screening mammogram detected a 9 mm and a 5 mm abnormality in the left breast   04/21/2017 Genetic Testing   BRCA2 c.5937C>G VUS identified on the STAT panel.  The STAT Breast cancer panel offered by [facility] includes sequencing and rearrangement analysis for the following 9 genes:  ATM, BRCA1, BRCA2, CDH1, CHEK2, PALB2, PTEN, STK11 and TP53.   The report date is April 21, 2017.    Testing was reflexed to the larger common hereditary cancer panel and the melanoma cancer panel. This testing was NEGATIVE.  The Hereditary Gene Panel offered by [facility] includes sequencing and/or deletion duplication testing of the following 46 genes: APC, ATM, AXIN2, BARD1, BMPR1A, BRCA1, BRCA2, BRIP1, CDH1, CDKN2A (p14ARF), CDKN2A (p16INK4a), CHEK2, CTNNA1, DICER1, EPCAM (Deletion/duplication testing only), GREM1 (promoter region deletion/duplication testing only), KIT, MEN1, MLH1, MSH2, MSH3, MSH6, MUTYH, NBN, NF1, NHTL1, PALB2, PDGFRA, PMS2, POLD1, POLE, PTEN, RAD50, RAD51C, RAD51D, SDHB, SDHC, SDHD, SMAD4, SMARCA4. STK11, TP53, TSC1, TSC2, and VHL.  The following genes were evaluated for sequence changes only: SDHA and HOXB13 c.251G>A variant only.  The Melanoma panel offered by [facility] includes sequencing and/or deletion duplication testing of the following 12 genes: BAP1, BRCA1, BRCA2, BRIP1,  CDK4, CDKN2A (p14ARF), CDKN2A (p16INK4a), MC1R, POT1, PTEN, RB1, TERT, and TP53.  The following gene was evaluated for sequence changes only: MITF (c.952G>A, p.GLU318Lys variant only).   The report date is April 29, 2017.     06/12/2017 Surgery   Left Mastectomy: Low-grade DCIS 2.7 cm, margins negative, ER + PR +, Tis Nx stage 0   07/2017 -  Anti-estrogen oral therapy   Tamoxifen daily     CHIEF COMPLIANT: Follow-up of left breast DCIS on tamoxifen therapy  INTERVAL HISTORY: Meagan [name] is a 44 y.o. with above-mentioned history of left breast DCIS who underwent a left mastectomy and is currently on tamoxifen therapy. Mammogram on 06/16/19 showed no evidence of malignancy bilaterally. She presents to the clinic today for annual follow-up.  She has night sweats as well as occasional joint stiffness.  She is otherwise doing quite well.  Denies any lumps or nodules in the breast.  She has bilateral implants.  ALLERGIES:  is allergic to erythromycin.  MEDICATIONS:  Current Outpatient Medications  Medication Sig Dispense Refill  . cholecalciferol (VITAMIN D3) 25 MCG (1000 UT) tablet Take 3 tablets (3,000 Units total) by mouth daily.    . tamoxifen (NOLVADEX) 20 MG tablet TAKE 1 TABLET DAILY 90 tablet 4  . Turmeric 500 MG CAPS Take 1 capsule by mouth daily.     No current facility-administered medications for this visit.    PHYSICAL EXAMINATION: ECOG PERFORMANCE STATUS: 1 - Symptomatic but completely ambulatory  Vitals:   10/06/19 0854  BP: 129/81  Pulse: 73  Resp: 18  Temp: 98.2 F (36.8 C)  SpO2: 100%   Filed Weights   10/06/19 0854  Weight: 138 lb 8 oz (62.8  kg)    BREAST: No palpable masses or nodules in either right or left breasts. No palpable axillary supraclavicular or infraclavicular adenopathy no breast tenderness or nipple discharge. (exam performed in the presence of a chaperone)  LABORATORY DATA:  I have reviewed the data as listed No flowsheet data found.  Lab  Results  Component Value Date   WBC 6.6 11/30/2017   HGB 12.5 11/30/2017   HCT 37.9 11/30/2017   MCV 90.7 11/30/2017   PLT 173 11/30/2017    ASSESSMENT & PLAN:  Ductal carcinoma in situ (DCIS) of left breast 06/12/2017:Left Mastectomy: Low-grade DCIS 2.7 cm, margins negative, ER and a pleasant, PR and a pleasant, Tis Nx stage 0  Current treatment: Tamoxifen 20 mg daily x5 years started 07/02/2017 Tamoxifen toxicities: Mild hot flashes at night.  Occasional joint stiffness and achiness. She is being very active and exercise regularly. She is taking extreme care with her diet as well.  Breast cancer surveillance: 1.Breast exam  10/06/2019: Benign 2.Breast MRI 10/01/2018: No evidence of cancer in the right breast status post left mastectomy.  Bilateral breast implants appear intact.  Recommended screening mammogram in 1 year. Plan is to obtain breast MRI every couple of2 yrs.Next MRI will be obtained in December 2021. 3.  Mammogram 06/16/2019: No evidence of malignancy, breast density category B (previous density was category D) This is a surprising decrease in breast density.  Return to clinic in 1 year for follow-up    No orders of the defined types were placed in this encounter.  The patient has a good understanding of the overall plan. she agrees with it. she will call with any problems that may develop before the next visit here.  Nicholas Lose, MD 10/06/2019  Julious Oka Dorshimer, am acting as scribe for Dr. Nicholas Lose.  I have reviewed the above document for accuracy and completeness, and I agree with the above.

## 2019-10-06 ENCOUNTER — Inpatient Hospital Stay: Payer: 59 | Attending: Hematology and Oncology | Admitting: Hematology and Oncology

## 2019-10-06 ENCOUNTER — Other Ambulatory Visit: Payer: Self-pay

## 2019-10-06 ENCOUNTER — Telehealth: Payer: Self-pay | Admitting: Hematology and Oncology

## 2019-10-06 VITALS — BP 129/81 | HR 73 | Temp 98.2°F | Resp 18 | Ht 66.0 in | Wt 138.5 lb

## 2019-10-06 DIAGNOSIS — Z803 Family history of malignant neoplasm of breast: Secondary | ICD-10-CM

## 2019-10-06 DIAGNOSIS — D0512 Intraductal carcinoma in situ of left breast: Secondary | ICD-10-CM | POA: Diagnosis present

## 2019-10-06 DIAGNOSIS — Z79810 Long term (current) use of selective estrogen receptor modulators (SERMs): Secondary | ICD-10-CM | POA: Insufficient documentation

## 2019-10-06 DIAGNOSIS — Z79899 Other long term (current) drug therapy: Secondary | ICD-10-CM | POA: Diagnosis not present

## 2019-10-06 DIAGNOSIS — Z170 Estrogen receptor positive status [ER+]: Secondary | ICD-10-CM | POA: Insufficient documentation

## 2019-10-06 DIAGNOSIS — Z9012 Acquired absence of left breast and nipple: Secondary | ICD-10-CM | POA: Insufficient documentation

## 2019-10-06 MED ORDER — TAMOXIFEN CITRATE 20 MG PO TABS: 20.0000 mg | ORAL_TABLET | Freq: Every day | ORAL | 4 refills | Status: DC

## 2019-10-06 NOTE — Telephone Encounter (Signed)
I left a message regarding schedule  

## 2019-10-06 NOTE — Assessment & Plan Note (Signed)
06/12/2017:Left Mastectomy: Low-grade DCIS 2.7 cm, margins negative, ER and a pleasant, PR and a pleasant, Tis Nx stage 0  Current treatment: Tamoxifen 20 mg daily x5 years started 07/02/2017 Tamoxifen toxicities: Denies any hot flashes or myalgias.  Breast cancer surveillance: 1.Breast exam  10/06/2019: Benign 2.Breast MRI 10/01/2018: No evidence of cancer in the right breast status post left mastectomy.  Bilateral breast implants appear intact.  Recommended screening mammogram in 1 year. Plan is to obtain breast MRI every couple of2 yrs.Next MRI will be obtained in December 2021. 3.  Mammogram 06/16/2019: No evidence of malignancy, breast density category B (previous density was category D)  Return to clinic in 1 year for follow-up

## 2019-12-17 ENCOUNTER — Other Ambulatory Visit: Payer: Self-pay | Admitting: Hematology and Oncology

## 2020-03-30 IMAGING — MG MM DIGITAL SCREENING UNILAT*R* IMPLANT  W/ TOMO W/ CAD
6 series · 6 of 14 positions shown · non-contrast
Comparison: Previous exam(s).

CLINICAL DATA: Screening. History of left breast cancer in 0763
status post mastectomy.

EXAM:
DIGITAL SCREENING UNILATERAL RIGHT MAMMOGRAM WITH IMPLANTS, CAD AND
TOMO
The patient has a retropectoral implant. Standard and implant
displaced views were performed.

[R MLO]
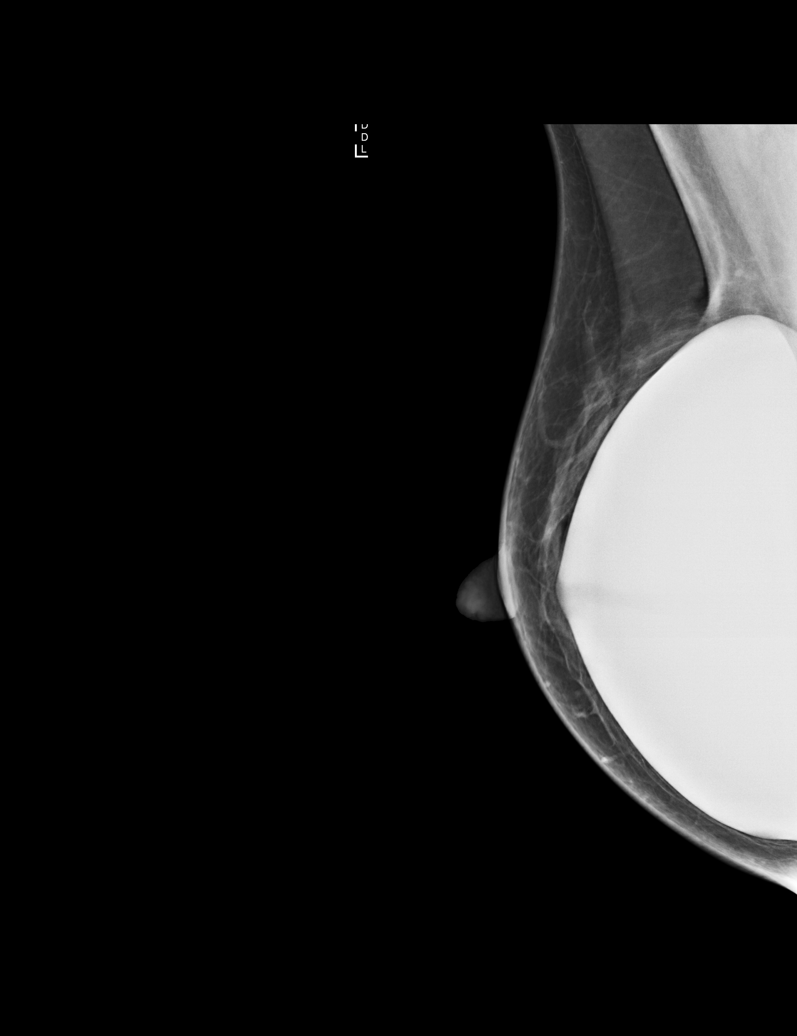

[R CC]
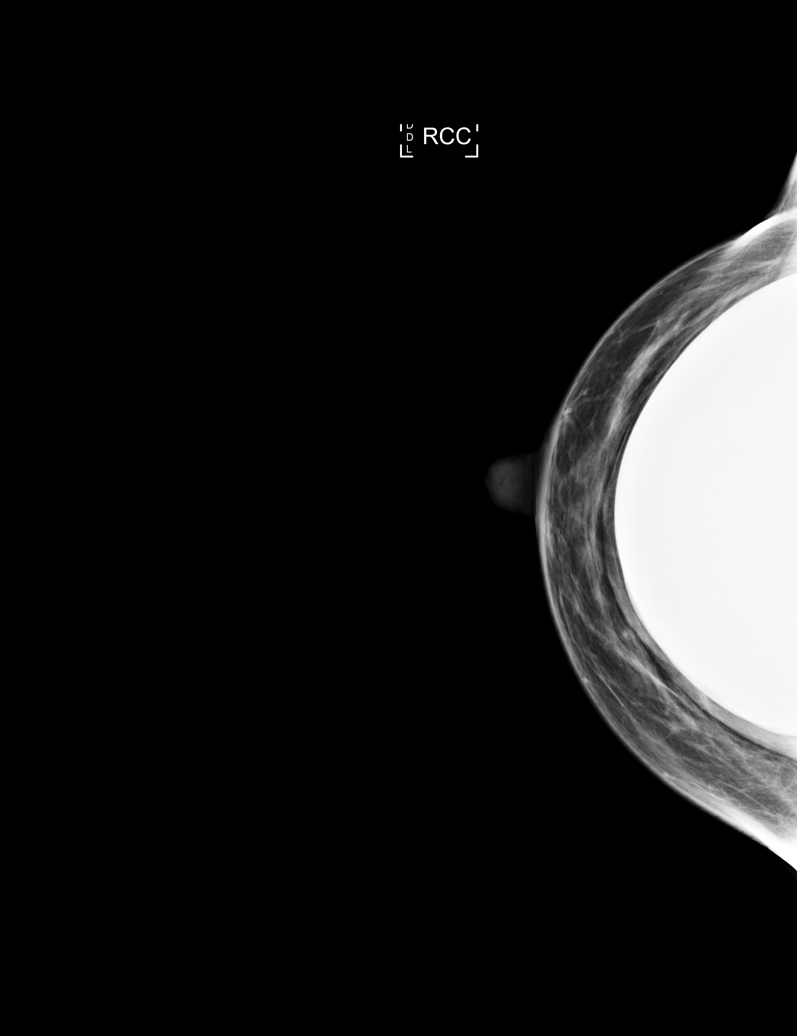

[R MLO synth-2D]
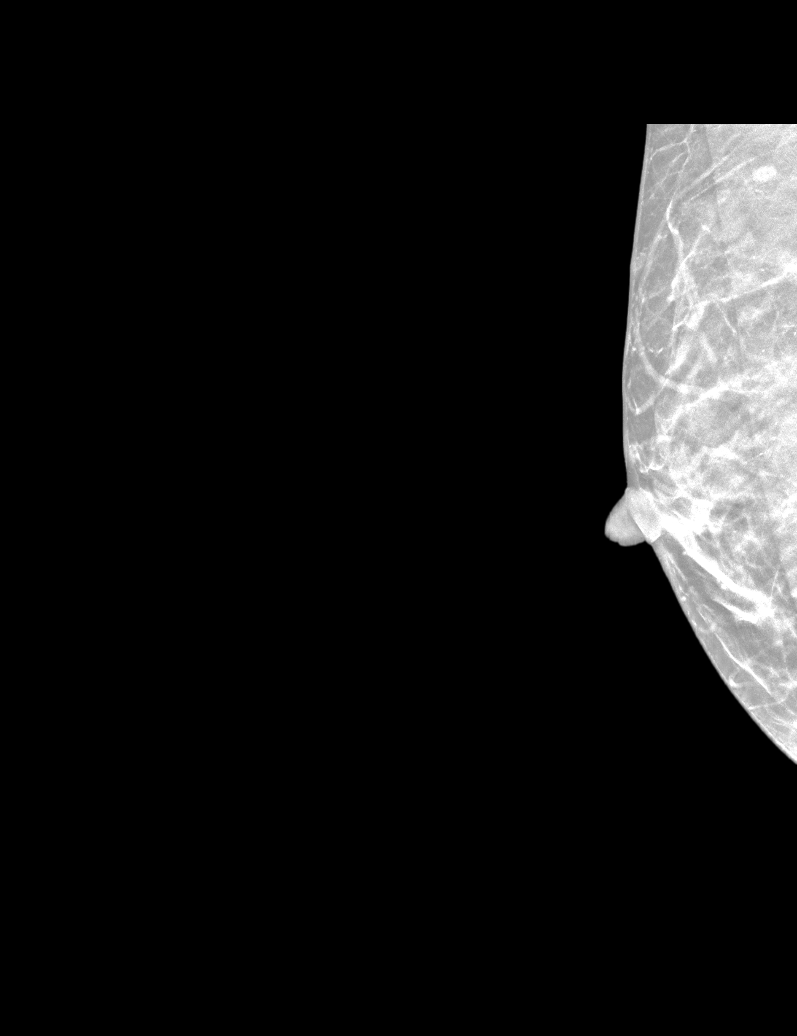

[R CC synth-2D]
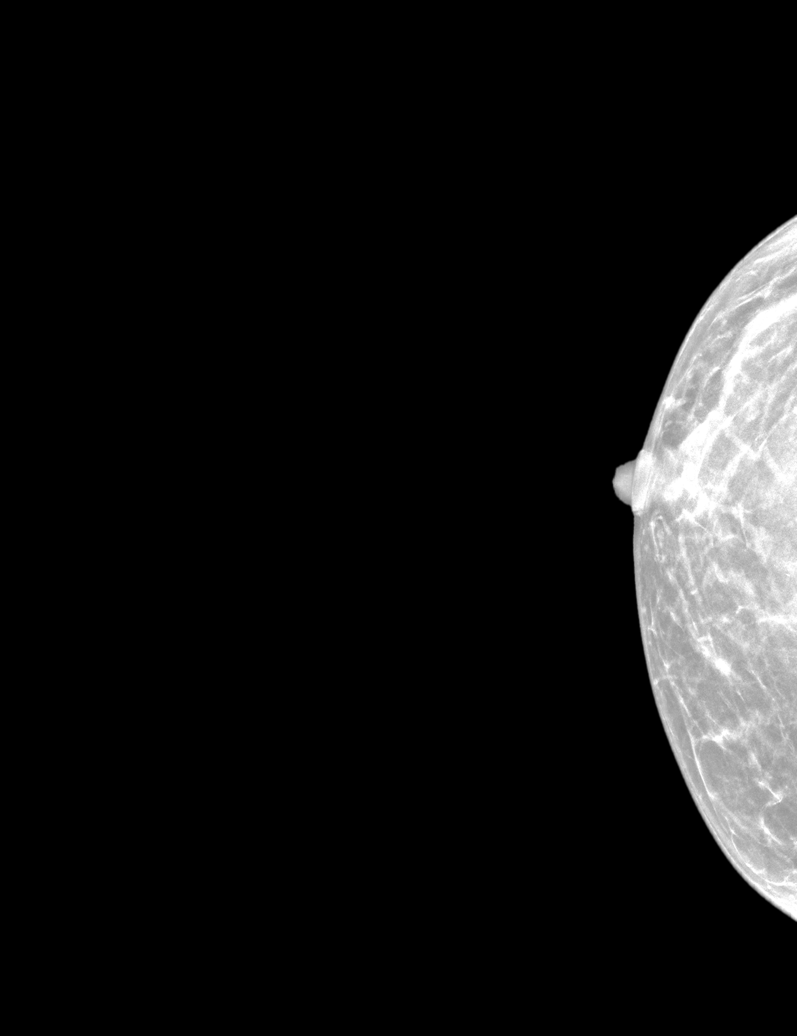

[R MLOID BREAST TOMOSYNTHESIS IMAGE tomo · tomo slice 17/34.0]
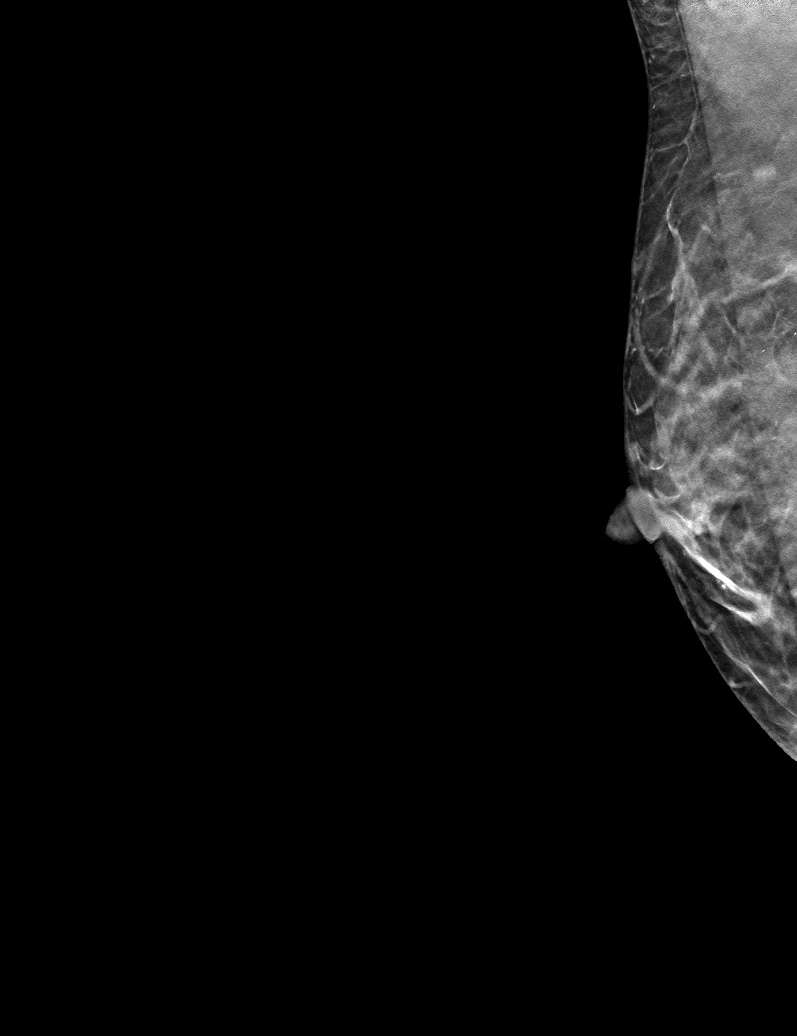

[R CCID BREAST TOMOSYNTHESIS IMAGE tomo · tomo slice 19/38.0]
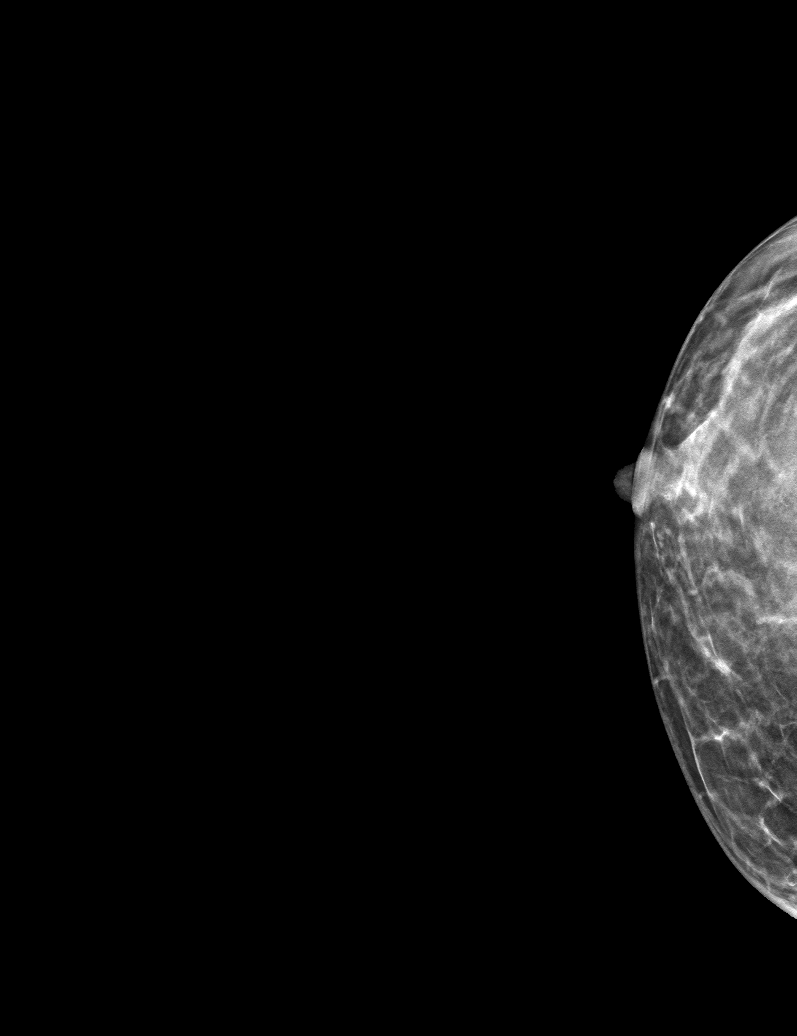

[6 of 14 positions shown; findings below may reference images not displayed]

ACR Breast Density Category b: There are scattered areas of
fibroglandular density.
FINDINGS: There are no findings suspicious for malignancy. Images were
processed with CAD.
IMPRESSION: No mammographic evidence of malignancy. A result letter of this
screening mammogram will be mailed directly to the patient.

RECOMMENDATION:
Screening mammogram in one year. (Code:[id number])

BI-RADS CATEGORY  1:  Negative.

## 2020-05-28 ENCOUNTER — Other Ambulatory Visit: Payer: Self-pay | Admitting: Adult Health

## 2020-05-28 DIAGNOSIS — Z0000 Encounter for general adult medical examination without abnormal findings: Secondary | ICD-10-CM

## 2020-06-17 ENCOUNTER — Other Ambulatory Visit: Payer: Self-pay | Admitting: Adult Health

## 2020-06-17 ENCOUNTER — Ambulatory Visit
Admission: RE | Admit: 2020-06-17 | Discharge: 2020-06-17 | Disposition: A | Discharge status: Home / Self Care | Payer: PRIVATE HEALTH INSURANCE | Source: Ambulatory Visit | Attending: Adult Health | Admitting: Adult Health

## 2020-06-17 ENCOUNTER — Other Ambulatory Visit: Payer: Self-pay

## 2020-06-17 DIAGNOSIS — Z0000 Encounter for general adult medical examination without abnormal findings: Secondary | ICD-10-CM

## 2020-07-02 DIAGNOSIS — Z853 Personal history of malignant neoplasm of breast: Secondary | ICD-10-CM | POA: Insufficient documentation

## 2020-08-10 ENCOUNTER — Telehealth: Payer: Self-pay | Admitting: *Deleted

## 2020-08-10 NOTE — Telephone Encounter (Signed)
Received VM from pt.  Attempt x1 to return call, no answer, LVM to return call to the office.  

## 2020-10-04 ENCOUNTER — Telehealth: Payer: Self-pay | Admitting: Hematology and Oncology

## 2020-10-04 NOTE — Assessment & Plan Note (Deleted)
06/12/2017:Left Mastectomy: Low-grade DCIS 2.7 cm, margins negative, ER and a pleasant, PR and a pleasant, Tis Nx stage 0  Current treatment: Tamoxifen 20 mg daily x5 years started 07/02/2017 Tamoxifen toxicities: Mild hot flashes at night.  Occasional joint stiffness and achiness. She is being very active and exercise regularly. She is taking extreme care with her diet as well.  Breast cancer surveillance: 1.Breast exam 10/06/2019: Benign 2.Breast MRI 06/18/20: No evidence of cancer in the right breast status post left mastectomy. Bilateral breast implants appear intact. Recommended screening mammogram in 1 year. Plan is to obtain breast MRI every couple of2 yrs.next MRI is scheduled for 10/10/2020 3.Mammogram  06/18/2020: No evidence of malignancy, breast density category B (previous density was category D)   Return to clinicin 1 year for follow-up

## 2020-10-04 NOTE — Telephone Encounter (Signed)
Left message to reschedule appointment per 1/3 schedule message. Gave option to call back to reschedule. 

## 2020-10-05 ENCOUNTER — Ambulatory Visit: Payer: 59 | Admitting: Hematology and Oncology

## 2020-10-05 ENCOUNTER — Other Ambulatory Visit: Payer: PRIVATE HEALTH INSURANCE

## 2020-10-06 ENCOUNTER — Telehealth: Payer: Self-pay | Admitting: Hematology and Oncology

## 2020-10-06 ENCOUNTER — Other Ambulatory Visit: Payer: Self-pay | Admitting: Hematology and Oncology

## 2020-10-06 ENCOUNTER — Telehealth: Payer: Self-pay | Admitting: *Deleted

## 2020-10-06 MED ORDER — DIAZEPAM 5 MG PO TABS: 5.0000 mg | ORAL_TABLET | Freq: Once | ORAL | 0 refills | Status: AC

## 2020-10-06 NOTE — Telephone Encounter (Signed)
Received call from pt requesting oral anti anxiety medication to help with claustrophobia during breast MRI on Sunday.  RN will review with MD for recommendations.

## 2020-10-06 NOTE — Telephone Encounter (Signed)
Scheduled appts per 1/5 incoming call from pt. Pt confirmed appt date/time

## 2020-10-09 ENCOUNTER — Other Ambulatory Visit: Payer: PRIVATE HEALTH INSURANCE

## 2020-10-10 ENCOUNTER — Other Ambulatory Visit: Payer: PRIVATE HEALTH INSURANCE

## 2020-10-12 ENCOUNTER — Other Ambulatory Visit: Payer: Self-pay | Admitting: *Deleted

## 2020-10-12 DIAGNOSIS — D0512 Intraductal carcinoma in situ of left breast: Secondary | ICD-10-CM

## 2020-10-13 NOTE — Progress Notes (Signed)
Patient Care Team: Fanny Bien, MD as PCP - General (Family Medicine) Nicholas Lose, MD as Consulting Physician (Hematology and Oncology) Delice Bison, Charlestine Massed, NP as Nurse Practitioner (Hematology and Oncology) Irene Limbo, MD as Consulting Physician (Plastic Surgery) Coralie Keens, MD as Consulting Physician (General Surgery)  DIAGNOSIS:    ICD-10-CM   1. Ductal carcinoma in situ (DCIS) of left breast  D05.12     SUMMARY OF ONCOLOGIC HISTORY: Oncology History  Ductal carcinoma in situ (DCIS) of left breast  04/10/2017 Initial Diagnosis   Left breast biopsy 12:00 and 12:30 position: DCIS, grade 1; screening mammogram detected a 9 mm and a 5 mm abnormality in the left breast   04/21/2017 Genetic Testing   BRCA2 c.5937C>G VUS identified on the STAT panel.  The STAT Breast cancer panel offered by [facility] includes sequencing and rearrangement analysis for the following 9 genes:  ATM, BRCA1, BRCA2, CDH1, CHEK2, PALB2, PTEN, STK11 and TP53.   The report date is April 21, 2017.    Testing was reflexed to the larger common hereditary cancer panel and the melanoma cancer panel. This testing was NEGATIVE.  The Hereditary Gene Panel offered by [facility] includes sequencing and/or deletion duplication testing of the following 46 genes: APC, ATM, AXIN2, BARD1, BMPR1A, BRCA1, BRCA2, BRIP1, CDH1, CDKN2A (p14ARF), CDKN2A (p16INK4a), CHEK2, CTNNA1, DICER1, EPCAM (Deletion/duplication testing only), GREM1 (promoter region deletion/duplication testing only), KIT, MEN1, MLH1, MSH2, MSH3, MSH6, MUTYH, NBN, NF1, NHTL1, PALB2, PDGFRA, PMS2, POLD1, POLE, PTEN, RAD50, RAD51C, RAD51D, SDHB, SDHC, SDHD, SMAD4, SMARCA4. STK11, TP53, TSC1, TSC2, and VHL.  The following genes were evaluated for sequence changes only: SDHA and HOXB13 c.251G>A variant only.  The Melanoma panel offered by [facility] includes sequencing and/or deletion duplication testing of the following 12 genes: BAP1, BRCA1, BRCA2, BRIP1,  CDK4, CDKN2A (p14ARF), CDKN2A (p16INK4a), MC1R, POT1, PTEN, RB1, TERT, and TP53.  The following gene was evaluated for sequence changes only: MITF (c.952G>A, p.GLU318Lys variant only).   The report date is April 29, 2017.     06/12/2017 Surgery   Left Mastectomy: Low-grade DCIS 2.7 cm, margins negative, ER + PR +, Tis Nx stage 0   07/2017 -  Anti-estrogen oral therapy   Tamoxifen daily     CHIEF COMPLIANT: Follow-up of left breast DCIS on tamoxifen therapy  INTERVAL HISTORY: Meagan [name] is a 45 y.o. with above-mentioned history of left breast DCIS who underwenta leftmastectomy and is currently on tamoxifen therapy.Mammogram on 06/17/20 showed no evidence of malignancy in the right breast.She presents to the clinic todayfor annual follow-up.   Other than mild discomfort in the chest wall because of the implants there is no other issues or concerns.  She is tolerating tamoxifen extremely well.  ALLERGIES:  is allergic to erythromycin.  MEDICATIONS:  Current Outpatient Medications  Medication Sig Dispense Refill   cholecalciferol (VITAMIN D3) 25 MCG (1000 UT) tablet Take 3 tablets (3,000 Units total) by mouth daily.     tamoxifen (NOLVADEX) 20 MG tablet Take 1 tablet (20 mg total) by mouth daily. 90 tablet 4   Turmeric 500 MG CAPS Take 1 capsule by mouth daily.     No current facility-administered medications for this visit.    PHYSICAL EXAMINATION: ECOG PERFORMANCE STATUS: 1 - Symptomatic but completely ambulatory  Vitals:   10/14/20 0828  BP: 131/74  Pulse: (!) 54  Resp: 15  Temp: 97.7 F (36.5 C)  SpO2: 100%   Filed Weights   10/14/20 0828  Weight: 136 lb 9.6 oz (62  kg)    BREAST: No palpable masses or nodules in either right or left breasts. No palpable axillary supraclavicular or infraclavicular adenopathy no breast tenderness or nipple discharge. (exam performed in the presence of a chaperone)  LABORATORY DATA:  I have reviewed the data as listed No flowsheet  data found.  Lab Results  Component Value Date   WBC 5.9 10/14/2020   HGB 12.9 10/14/2020   HCT 39.1 10/14/2020   MCV 90.9 10/14/2020   PLT 164 10/14/2020   NEUTROABS 3.0 10/14/2020    ASSESSMENT & PLAN:  Ductal carcinoma in situ (DCIS) of left breast 06/12/2017:Left Mastectomy: Low-grade DCIS 2.7 cm, margins negative, ER and a pleasant, PR and a pleasant, Tis Nx stage 0  Current treatment: Tamoxifen 20 mg daily x5 years started 07/02/2017 Tamoxifen toxicities: Mild hot flashes at night.  Occasional joint stiffness and achiness. She is being very active and exercise regularly.  The implants cause her some discomfort She is taking extreme care with her diet as well.  Breast cancer surveillance: 1.Breast exam  10/14/2020: Benign 2.Breast MRI 10/01/2018: No evidence of cancer in the right breast status post left mastectomy. Bilateral breast implants appear intact. Plan is to obtain breast MRI every couple of2 yrs.  3.Mammogram 06/18/2020: No evidence of malignancy, breast density category B (previous density was category D) This is a surprising decrease in breast density.  There were some issues ordering the MRI because of the change in insurance.  She is likely to get this done in the next few weeks.  Return to clinicin 1 year for follow-up  No orders of the defined types were placed in this encounter.  The patient has a good understanding of the overall plan. she agrees with it. she will call with any problems that may develop before the next visit here.  Total time spent: 20 mins including face to face time and time spent for planning, charting and coordination of care  Nicholas Lose, MD 10/14/2020  I, Cloyde Reams Dorshimer, am acting as scribe for Dr. Nicholas Lose.  I have reviewed the above documentation for accuracy and completeness, and I agree with the above.

## 2020-10-14 ENCOUNTER — Inpatient Hospital Stay: Payer: BC Managed Care – PPO | Attending: Hematology and Oncology | Admitting: Hematology and Oncology

## 2020-10-14 ENCOUNTER — Inpatient Hospital Stay: Payer: BC Managed Care – PPO

## 2020-10-14 ENCOUNTER — Other Ambulatory Visit: Payer: Self-pay

## 2020-10-14 DIAGNOSIS — N951 Menopausal and female climacteric states: Secondary | ICD-10-CM | POA: Diagnosis not present

## 2020-10-14 DIAGNOSIS — Z79810 Long term (current) use of selective estrogen receptor modulators (SERMs): Secondary | ICD-10-CM | POA: Insufficient documentation

## 2020-10-14 DIAGNOSIS — D0512 Intraductal carcinoma in situ of left breast: Secondary | ICD-10-CM | POA: Diagnosis not present

## 2020-10-14 DIAGNOSIS — Z9012 Acquired absence of left breast and nipple: Secondary | ICD-10-CM | POA: Diagnosis not present

## 2020-10-14 LAB — CMP (CANCER CENTER ONLY)
ALT: 10 U/L (ref 0–44)
AST: 18 U/L (ref 15–41)
Albumin: 4 g/dL (ref 3.5–5.0)
Alkaline Phosphatase: 54 U/L (ref 38–126)
Anion gap: 7 (ref 5–15)
BUN: 13 mg/dL (ref 6–20)
CO2: 24 mmol/L (ref 22–32)
Calcium: 9.1 mg/dL (ref 8.9–10.3)
Chloride: 109 mmol/L (ref 98–111)
Creatinine: 0.79 mg/dL (ref 0.44–1.00)
GFR, Estimated: >60 mL/min (ref >60)
Glucose, Bld: 96 mg/dL (ref 70–99)
Potassium: 3.9 mmol/L (ref 3.5–5.1)
Sodium: 140 mmol/L (ref 135–145)
Total Bilirubin: 0.5 mg/dL (ref 0.3–1.2)
Total Protein: 6.8 g/dL (ref 6.5–8.1)

## 2020-10-14 LAB — CBC WITH DIFFERENTIAL (CANCER CENTER ONLY)
Abs Immature Granulocytes: 0.01 10*3/uL (ref 0.00–0.07)
Basophils Absolute: 0.1 10*3/uL (ref 0.0–0.1)
Basophils Relative: 1 %
Eosinophils Absolute: 0.2 10*3/uL (ref 0.0–0.5)
Eosinophils Relative: 3 %
HCT: 39.1 % (ref 36.0–46.0)
Hemoglobin: 12.9 g/dL (ref 12.0–15.0)
Immature Granulocytes: 0 %
Lymphocytes Relative: 37 %
Lymphs Abs: 2.2 10*3/uL (ref 0.7–4.0)
MCH: 30 pg (ref 26.0–34.0)
MCHC: 33 g/dL (ref 30.0–36.0)
MCV: 90.9 fL (ref 80.0–100.0)
Monocytes Absolute: 0.5 10*3/uL (ref 0.1–1.0)
Monocytes Relative: 9 %
Neutro Abs: 3 10*3/uL (ref 1.7–7.7)
Neutrophils Relative %: 50 %
Platelet Count: 164 10*3/uL (ref 150–400)
RBC: 4.3 MIL/uL (ref 3.87–5.11)
RDW: 12.6 % (ref 11.5–15.5)
WBC Count: 5.9 10*3/uL (ref 4.0–10.5)
nRBC: 0 % (ref 0.0–0.2)

## 2020-10-14 MED ORDER — TAMOXIFEN CITRATE 20 MG PO TABS: 20.0000 mg | ORAL_TABLET | Freq: Every day | ORAL | 4 refills | Status: DC

## 2020-10-14 NOTE — Assessment & Plan Note (Signed)
06/12/2017:Left Mastectomy: Low-grade DCIS 2.7 cm, margins negative, ER and a pleasant, PR and a pleasant, Tis Nx stage 0  Current treatment: Tamoxifen 20 mg daily x5 years started 07/02/2017 Tamoxifen toxicities: Mild hot flashes at night.  Occasional joint stiffness and achiness. She is being very active and exercise regularly. She is taking extreme care with her diet as well.  Breast cancer surveillance: 1.Breast exam  10/14/2020: Benign 2.Breast MRI 10/01/2018: No evidence of cancer in the right breast status post left mastectomy. Bilateral breast implants appear intact. Plan is to obtain breast MRI every couple of2 yrs.Next MRI will be obtained in December 2021. 3.Mammogram 06/18/2020: No evidence of malignancy, breast density category B (previous density was category D) This is a surprising decrease in breast density.  Return to clinicin 1 year for follow-up

## 2020-10-21 ENCOUNTER — Other Ambulatory Visit: Payer: Self-pay | Admitting: Pathology

## 2020-11-10 ENCOUNTER — Ambulatory Visit
Admission: RE | Admit: 2020-11-10 | Discharge: 2020-11-10 | Disposition: A | Discharge status: Home / Self Care | Payer: BC Managed Care – PPO | Source: Ambulatory Visit | Attending: Hematology and Oncology | Admitting: Hematology and Oncology

## 2020-11-10 ENCOUNTER — Other Ambulatory Visit: Payer: Self-pay

## 2020-11-10 DIAGNOSIS — Z803 Family history of malignant neoplasm of breast: Secondary | ICD-10-CM

## 2020-11-10 DIAGNOSIS — D0512 Intraductal carcinoma in situ of left breast: Secondary | ICD-10-CM

## 2020-11-10 MED ORDER — GADOBUTROL 1 MMOL/ML IV SOLN
6.0000 mL | Freq: Once | INTRAVENOUS | Status: AC | PRN
  Administered 2020-11-10: 6 mL via INTRAVENOUS

## 2020-11-26 ENCOUNTER — Ambulatory Visit (INDEPENDENT_AMBULATORY_CARE_PROVIDER_SITE_OTHER): Payer: BC Managed Care – PPO

## 2020-11-26 ENCOUNTER — Other Ambulatory Visit: Payer: Self-pay

## 2020-11-26 ENCOUNTER — Ambulatory Visit (INDEPENDENT_AMBULATORY_CARE_PROVIDER_SITE_OTHER): Payer: BC Managed Care – PPO | Admitting: Podiatrist

## 2020-11-26 ENCOUNTER — Other Ambulatory Visit: Payer: Self-pay | Admitting: Podiatrist

## 2020-11-26 ENCOUNTER — Encounter: Payer: Self-pay | Admitting: Podiatrist

## 2020-11-26 DIAGNOSIS — M79672 Pain in left foot: Secondary | ICD-10-CM

## 2020-11-26 DIAGNOSIS — M722 Plantar fascial fibromatosis: Secondary | ICD-10-CM

## 2020-11-26 DIAGNOSIS — H1045 Other chronic allergic conjunctivitis: Secondary | ICD-10-CM | POA: Insufficient documentation

## 2020-11-26 DIAGNOSIS — J301 Allergic rhinitis due to pollen: Secondary | ICD-10-CM | POA: Insufficient documentation

## 2020-11-26 DIAGNOSIS — J309 Allergic rhinitis, unspecified: Secondary | ICD-10-CM | POA: Insufficient documentation

## 2020-11-26 DIAGNOSIS — J3081 Allergic rhinitis due to animal (cat) (dog) hair and dander: Secondary | ICD-10-CM | POA: Insufficient documentation

## 2020-11-26 NOTE — Patient Instructions (Signed)
Plantar Fasciitis (Heel Spur Syndrome) with Rehab The plantar fascia is a fibrous, ligament-like, soft-tissue structure that spans the bottom of the foot. Plantar fasciitis is a condition that causes pain in the foot due to inflammation of the tissue. SYMPTOMS   Pain and tenderness on the underneath side of the foot.  Pain that worsens with standing or walking. CAUSES  Plantar fasciitis is caused by irritation and injury to the plantar fascia on the underneath side of the foot. Common mechanisms of injury include:  Direct trauma to bottom of the foot.  Damage to a small nerve that runs under the foot where the main fascia attaches to the heel bone. Stress placed on the plantar fascia due to any mild increased activity or injury RISK INCREASES WITH:   Obesity.  Poor strength and flexibility.  Improperly fitted shoes.  Tight calf muscles.  Flat feet.  Failure to warm-up properly before activity.  PREVENTION  Warm up and stretch properly before activity.  Strength, flexibility  Maintain a health body weight.  Avoid stress on the plantar fascia.  Wear properly fitted shoes, including arch supports for individuals who have flat feet. PROGNOSIS  If treated properly, then the symptoms of plantar fasciitis usually resolve without surgery. However, occasionally surgery is necessary. RELATED COMPLICATIONS   Recurrent symptoms that may result in a chronic condition.  Problems of the lower back that are caused by compensating for the injury, such as limping.  Pain or weakness of the foot during push-off following surgery.  Chronic inflammation, scarring, and partial or complete fascia tear, occurring more often from repeated injections. TREATMENT  Treatment initially involves the use of ice and medication to help reduce pain and inflammation. The use of strengthening and stretching exercises may help reduce pain with activity, especially stretches of the Achilles tendon.  Your  caregiver may recommend that you use arch supports to help reduce stress on the plantar fascia. Often, corticosteroid injections are given to reduce inflammation. If symptoms persist for greater than 6 months despite non-surgical (conservative), then surgery may be recommended.  MEDICATION   I recommend aleve (1 tab in am and 1 tab in pm) or advil/ibuprofen (400-600mg  am and pm) for 1-2 weeks to help alleviate the inflammation of plantar fasciitis.  If this does not help, a steroid injection may be warranted.    HEAT AND COLD  Cold treatment (icing) relieves pain and reduces inflammation. Cold treatment should be applied for 10 to 15 minutes every 2 to 3 hours for inflammation and pain and immediately after any activity that aggravates your symptoms. Use ice packs or massage the area with a piece of ice (ice massage).  Heat treatment may be used prior to performing the stretching and strengthening activities prescribed by your caregiver, physical therapist, or athletic trainer. Use a heat pack or soak the injury in warm water. SEEK IMMEDIATE MEDICAL CARE IF:  Treatment seems to offer no benefit, or the condition worsens.  Any medications produce adverse side effects.  Perform this particular stretch daily first thing in the morning and before you go to bed. Hold for 30 seconds.    Try all the exercises and choose your favorite 3 to perform daily--  EXERCISES-- perform each exercise a total of 10-15 repetitions.  Hold for 30 seconds and perform 3 times per day   RANGE OF MOTION (ROM) AND STRETCHING EXERCISES - Plantar Fasciitis (Heel Spur Syndrome) These exercises may help you when beginning to rehabilitate your injury.   While completing  these exercises, remember:   Restoring tissue flexibility helps normal motion to return to the joints. This allows healthier, less painful movement and activity.  An effective stretch should be held for at least 30 seconds.  A stretch should never be  painful. You should only feel a gentle lengthening or release in the stretched tissue. RANGE OF MOTION - Toe Extension, Flexion  Sit with your right / left leg crossed over your opposite knee.  Grasp your toes and gently pull them back toward the top of your foot. You should feel a stretch on the bottom of your toes and/or foot.  Hold this stretch for __________ seconds.  Now, gently pull your toes toward the bottom of your foot. You should feel a stretch on the top of your toes and or foot.  Hold this stretch for __________ seconds. Repeat __________ times. Complete this stretch __________ times per day.  RANGE OF MOTION - Ankle Dorsiflexion, Active Assisted  Remove shoes and sit on a chair that is preferably not on a carpeted surface.  Place right / left foot under knee. Extend your opposite leg for support.  Keeping your heel down, slide your right / left foot back toward the chair until you feel a stretch at your ankle or calf. If you do not feel a stretch, slide your bottom forward to the edge of the chair, while still keeping your heel down.  Hold this stretch for __________ seconds. Repeat __________ times. Complete this stretch __________ times per day.  STRETCH  Gastroc, Standing  Place hands on wall.  Extend right / left leg, keeping the front knee somewhat bent.  Slightly point your toes inward on your back foot.  Keeping your right / left heel on the floor and your knee straight, shift your weight toward the wall, not allowing your back to arch.  You should feel a gentle stretch in the right / left calf. Hold this position for __________ seconds. Repeat __________ times. Complete this stretch __________ times per day. STRETCH  Soleus, Standing  Place hands on wall.  Extend right / left leg, keeping the other knee somewhat bent.  Slightly point your toes inward on your back foot.  Keep your right / left heel on the floor, bend your back knee, and slightly shift  your weight over the back leg so that you feel a gentle stretch deep in your back calf.  Hold this position for __________ seconds. Repeat __________ times. Complete this stretch __________ times per day. STRETCH  Gastrocsoleus, Standing  Note: This exercise can place a lot of stress on your foot and ankle. Please complete this exercise only if specifically instructed by your caregiver.   Place the ball of your right / left foot on a step, keeping your other foot firmly on the same step.  Hold on to the wall or a rail for balance.  Slowly lift your other foot, allowing your body weight to press your heel down over the edge of the step.  You should feel a stretch in your right / left calf.  Hold this position for __________ seconds.  Repeat this exercise with a slight bend in your right / left knee. Repeat __________ times. Complete this stretch __________ times per day.  STRENGTHENING EXERCISES - Plantar Fasciitis (Heel Spur Syndrome)  These exercises may help you when beginning to rehabilitate your injury. They may resolve your symptoms with or without further involvement from your physician, physical therapist or athletic trainer. While completing these  exercises, remember:   Muscles can gain both the endurance and the strength needed for everyday activities through controlled exercises.  Complete these exercises as instructed by your physician, physical therapist or athletic trainer. Progress the resistance and repetitions only as guided.

## 2020-11-26 NOTE — Progress Notes (Signed)
Subjective: Meagan [name] is a 45 y.o. female patient presents to office with complaint of moderate heel pain on the left heel. Patient admits to post static dyskinesia for 2 weeks in duration. Patient has treated this problem with massage and exercises with some relief. Denies any other pedal complaints.   Patient Active Problem List   Diagnosis Date Noted  . Allergic rhinitis 11/26/2020  . Allergic rhinitis due to animal (cat) (dog) hair and dander 11/26/2020  . Allergic rhinitis due to pollen 11/26/2020  . Chronic allergic conjunctivitis 11/26/2020  . History of malignant neoplasm of breast 07/02/2020  . Bulging of lumbar intervertebral disc 12/26/2018  . Low back pain 09/02/2018  . History of ductal carcinoma in situ (DCIS) of breast 10/18/2017  . Acquired absence of left breast 06/20/2017  . Genetic testing 04/26/2017  . Family history of breast cancer   . Family history of melanoma   . Family history of colon cancer   . 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 azelastine (ASTELIN) 0.1 % nasal spray Place 2 sprays into both nostrils 2 (two) times daily.    . cholecalciferol (VITAMIN D3) 25 MCG (1000 UT) tablet Take 3 tablets (3,000 Units total) by mouth daily.    Marland Kitchen EPINEPHrine 0.3 mg/0.3 mL IJ SOAJ injection See admin instructions.    Marland Kitchen escitalopram (LEXAPRO) 5 MG tablet TK 1 T PO D    . fexofenadine (ALLEGRA) 180 MG tablet 1 tablet    . Lactobacillus Rhamnosus, GG, (RA PROBIOTIC DIGESTIVE CARE) CAPS Culturelle 10 billion cell capsule    . montelukast (SINGULAIR) 10 MG tablet Take 10 mg by mouth daily.    . Probiotic TBEC See admin instructions.    . tamoxifen (NOLVADEX) 20 MG tablet Take 1 tablet (20 mg total) by mouth daily. 90 tablet 4  . triamcinolone (KENALOG) 0.1 % Apply topically 2 (two) times daily.    . Turmeric 500 MG CAPS Take 1 capsule by mouth daily.     No  current facility-administered medications on file prior to visit.    Allergies  Allergen Reactions  . Erythromycin Nausea And Vomiting  . Other     Objective: Physical Exam General: The patient is alert and oriented x3 in no acute distress.  Dermatology: Skin is warm, dry and supple bilateral lower extremities. Nails 1-10 are normal. There is no erythema, edema, no eccymosis, no open lesions present. Integument is otherwise unremarkable.  Vascular: Dorsalis Pedis pulse and Posterior Tibial pulse are 2/4 bilateral. Capillary fill time is immediate to all digits.  Neurological: Grossly intact to light touch with an achilles reflex of +2/5 and a  negative Tinel's sign bilateral.  Musculoskeletal: Tenderness to palpation at the medial calcaneal tubercale and through the insertion of the plantar fascia on the Leftfoot. No pain with medial to lateral compression of calcaneus bilateral.There is minimal decreased Ankle joint range of motion left. All other joints range of motion within normal limits bilateral. Strength 5/5 in all groups bilateral.     Xray, leftfoot:  Normal osseous mineralization. Joint spaces preserved. No fracture/dislocation/boney destruction. No Calcaneal spur present.  Mildly prominent navicular is noted.   Assessment and Plan:   ICD-10-CM   1. Pain in left foot  M79.672 DG Foot Complete Left  2. Plantar fasciitis, left  M72.2      -Complete examination performed.  -Xrays reviewed -Discussed with patient  in detail the condition of plantar fasciitis, how this occurs and general treatment options. -recommended advil/aleve to start for 2 weeks and taper was advised after 2 weeks.  -Recommended good supportive shoes and advised use of OTC insert-  powerstep inserts are dispensed for her use. . Explained to patient that if these orthoses work well, we will continue with these. If these do not improve her condition and  pain, we will consider custom molded  orthoses. -Explained and dispensed to patient daily stretching exercises.. -Patient to return to office in 4 weeks if symptoms worsen or fail to improve. -  Recommend a steroid injection at that time if no improvement.

## 2021-04-08 ENCOUNTER — Telehealth: Payer: Self-pay | Admitting: Podiatrist

## 2021-04-08 NOTE — Telephone Encounter (Signed)
Pt left message on 7.6 @ 1228 stating she is our pt and would like to pick up 2 prs of the inserts she was given at her appt.  I returned call and left message that she got the powersteps and we do sell them in the office for 55.00 per pair and she can stop in and pick them up.

## 2021-06-30 ENCOUNTER — Other Ambulatory Visit: Payer: Self-pay | Admitting: Adult Health

## 2021-06-30 DIAGNOSIS — Z1231 Encounter for screening mammogram for malignant neoplasm of breast: Secondary | ICD-10-CM

## 2021-08-05 ENCOUNTER — Ambulatory Visit
Admission: RE | Admit: 2021-08-05 | Discharge: 2021-08-05 | Disposition: A | Discharge status: Home / Self Care | Payer: BC Managed Care – PPO | Source: Ambulatory Visit | Attending: Adult Health | Admitting: Adult Health

## 2021-08-05 ENCOUNTER — Other Ambulatory Visit: Payer: Self-pay

## 2021-08-05 DIAGNOSIS — Z1231 Encounter for screening mammogram for malignant neoplasm of breast: Secondary | ICD-10-CM

## 2021-10-13 ENCOUNTER — Telehealth: Payer: Self-pay | Admitting: Hematology and Oncology

## 2021-10-13 NOTE — Telephone Encounter (Signed)
Rescheduled appointment per 1/12 scheduling message. Left message.

## 2021-10-14 ENCOUNTER — Ambulatory Visit: Payer: PRIVATE HEALTH INSURANCE | Admitting: Hematology and Oncology

## 2021-10-26 ENCOUNTER — Telehealth: Payer: Self-pay | Admitting: Hematology and Oncology

## 2021-10-26 NOTE — Telephone Encounter (Signed)
R/s per pt req , 1/25 inbasket, pt aware

## 2021-10-27 ENCOUNTER — Inpatient Hospital Stay: Payer: BC Managed Care – PPO | Admitting: Hematology and Oncology

## 2021-11-03 NOTE — Assessment & Plan Note (Signed)
06/12/2017:Left Mastectomy: Low-grade DCIS 2.7 cm, margins negative, ER and a pleasant, PR and a pleasant, Tis Nx stage 0  Current treatment: Tamoxifen 20 mg daily x5 years started 07/02/2017 Tamoxifen toxicities:Mild hot flashes at night. Occasional joint stiffness and achiness. She is being very active and exercise regularly.  The implants cause her some discomfort She is taking extreme care with her diet as well.  Breast cancer surveillance: 1.Breast exam2/11/2021: Benign 2.Breast MRI 11/10/2020: No evidence of cancer in the right breast status post left mastectomy. Bilateral breast implants appear intact. Plan is to obtain breast MRI every couple of2 yrs.  3.Mammogram11/01/2019: No evidence of malignancy, breast density categoryB(previous density was category D)  Return to clinicin 1 year for follow-up

## 2021-11-03 NOTE — Progress Notes (Signed)
Patient Care Team: Fanny Bien, MD as PCP - General (Family Medicine) Nicholas Lose, MD as Consulting Physician (Hematology and Oncology) Delice Bison, Charlestine Massed, NP as Nurse Practitioner (Hematology and Oncology) Irene Limbo, MD as Consulting Physician (Plastic Surgery) Coralie Keens, MD as Consulting Physician (General Surgery)  DIAGNOSIS:    ICD-10-CM   1. Ductal carcinoma in situ (DCIS) of left breast  D05.12       SUMMARY OF ONCOLOGIC HISTORY: Oncology History  Ductal carcinoma in situ (DCIS) of left breast  04/10/2017 Initial Diagnosis   Left breast biopsy 12:00 and 12:30 position: DCIS, grade 1; screening mammogram detected a 9 mm and a 5 mm abnormality in the left breast   04/21/2017 Genetic Testing   BRCA2 c.5937C>G VUS identified on the STAT panel.  The STAT Breast cancer panel offered by [facility] includes sequencing and rearrangement analysis for the following 9 genes:  ATM, BRCA1, BRCA2, CDH1, CHEK2, PALB2, PTEN, STK11 and TP53.   The report date is April 21, 2017.    Testing was reflexed to the larger common hereditary cancer panel and the melanoma cancer panel. This testing was NEGATIVE.  The Hereditary Gene Panel offered by [facility] includes sequencing and/or deletion duplication testing of the following 46 genes: APC, ATM, AXIN2, BARD1, BMPR1A, BRCA1, BRCA2, BRIP1, CDH1, CDKN2A (p14ARF), CDKN2A (p16INK4a), CHEK2, CTNNA1, DICER1, EPCAM (Deletion/duplication testing only), GREM1 (promoter region deletion/duplication testing only), KIT, MEN1, MLH1, MSH2, MSH3, MSH6, MUTYH, NBN, NF1, NHTL1, PALB2, PDGFRA, PMS2, POLD1, POLE, PTEN, RAD50, RAD51C, RAD51D, SDHB, SDHC, SDHD, SMAD4, SMARCA4. STK11, TP53, TSC1, TSC2, and VHL.  The following genes were evaluated for sequence changes only: SDHA and HOXB13 c.251G>A variant only.  The Melanoma panel offered by [facility] includes sequencing and/or deletion duplication testing of the following 12 genes: BAP1, BRCA1, BRCA2, BRIP1,  CDK4, CDKN2A (p14ARF), CDKN2A (p16INK4a), MC1R, POT1, PTEN, RB1, TERT, and TP53.  The following gene was evaluated for sequence changes only: MITF (c.952G>A, p.GLU318Lys variant only).   The report date is April 29, 2017.     06/12/2017 Surgery   Left Mastectomy: Low-grade DCIS 2.7 cm, margins negative, ER + PR +, Tis Nx stage 0   07/2017 -  Anti-estrogen oral therapy   Tamoxifen daily     CHIEF COMPLIANT: Follow-up of left breast DCIS on tamoxifen therapy  INTERVAL HISTORY: Meagan [name] is a 46 y.o. with above-mentioned history of left breast DCIS who underwent a left mastectomy and is currently on tamoxifen therapy. Mammogram on 08/05/2021 showed no evidence of malignancy in the right breast. She presents to the clinic today for annual follow-up.  She denies any major problems or concerns.  She has very occasional hot flashes especially at night.  Denies any pain or discomfort in the chest wall or axilla.  ALLERGIES:  is allergic to erythromycin and other.  MEDICATIONS:  Current Outpatient Medications  Medication Sig Dispense Refill   azelastine (ASTELIN) 0.1 % nasal spray Place 2 sprays into both nostrils 2 (two) times daily.     cholecalciferol (VITAMIN D3) 25 MCG (1000 UT) tablet Take 3 tablets (3,000 Units total) by mouth daily.     EPINEPHrine 0.3 mg/0.3 mL IJ SOAJ injection See admin instructions.     escitalopram (LEXAPRO) 5 MG tablet TK 1 T PO D     fexofenadine (ALLEGRA) 180 MG tablet 1 tablet     Lactobacillus Rhamnosus, GG, (RA PROBIOTIC DIGESTIVE CARE) CAPS Culturelle 10 billion cell capsule     montelukast (SINGULAIR) 10 MG tablet Take  10 mg by mouth daily.     Probiotic TBEC See admin instructions.     tamoxifen (NOLVADEX) 20 MG tablet Take 1 tablet (20 mg total) by mouth daily. 90 tablet 4   triamcinolone (KENALOG) 0.1 % Apply topically 2 (two) times daily.     Turmeric 500 MG CAPS Take 1 capsule by mouth daily.     No current facility-administered medications for  this visit.    PHYSICAL EXAMINATION: ECOG PERFORMANCE STATUS: 1 - Symptomatic but completely ambulatory  Vitals:   11/04/21 1055  BP: 110/70  Pulse: (!) 59  Resp: 16  Temp: 98.1 F (36.7 C)  SpO2: 100%   Filed Weights   11/04/21 1055  Weight: 139 lb (63 kg)    BREAST: (exam performed in the presence of a chaperone)  LABORATORY DATA:  I have reviewed the data as listed CMP Latest Ref Rng & Units 10/14/2020  Glucose 70 - 99 mg/dL 96  BUN 6 - 20 mg/dL 13  Creatinine 0.44 - 1.00 mg/dL 0.79  Sodium 135 - 145 mmol/L 140  Potassium 3.5 - 5.1 mmol/L 3.9  Chloride 98 - 111 mmol/L 109  CO2 22 - 32 mmol/L 24  Calcium 8.9 - 10.3 mg/dL 9.1  Total Protein 6.5 - 8.1 g/dL 6.8  Total Bilirubin 0.3 - 1.2 mg/dL 0.5  Alkaline Phos 38 - 126 U/L 54  AST 15 - 41 U/L 18  ALT 0 - 44 U/L 10    Lab Results  Component Value Date   WBC 5.9 10/14/2020   HGB 12.9 10/14/2020   HCT 39.1 10/14/2020   MCV 90.9 10/14/2020   PLT 164 10/14/2020   NEUTROABS 3.0 10/14/2020    ASSESSMENT & PLAN:  Ductal carcinoma in situ (DCIS) of left breast 06/12/2017: Left Mastectomy: Low-grade DCIS 2.7 cm, margins negative, ER and a pleasant, PR and a pleasant, Tis Nx stage 0   Current treatment: Tamoxifen 20 mg daily x5 years started 07/02/2017 will complete October 2023 Tamoxifen toxicities: Mild hot flashes at night.  Occasional joint stiffness and achiness. She is being very active and exercise regularly.       Breast cancer surveillance: 1. Breast exam 11/04/2021: Benign 2. Breast MRI 11/10/2020: No evidence of cancer in the right breast status post left mastectomy.  Bilateral breast implants appear intact. Plan is to obtain breast MRI every couple of 2 yrs.    3.  Mammogram 08/2021: No evidence of malignancy, breast density category B (previous density was category D)  Breast MRI will be ordered for 2024 Return to clinic  in 1 year for follow-up      No orders of the defined types were placed in  this encounter.  The patient has a good understanding of the overall plan. she agrees with it. she will call with any problems that may develop before the next visit here.  Total time spent: 20 mins including face to face time and time spent for planning, charting and coordination of care  Rulon Eisenmenger, MD, MPH 11/04/2021  I, Thana Ates, am acting as scribe for Dr. Nicholas Lose.  I have reviewed the above documentation for accuracy and completeness, and I agree with the above.

## 2021-11-04 ENCOUNTER — Inpatient Hospital Stay: Payer: BC Managed Care – PPO | Attending: Hematology and Oncology | Admitting: Hematology and Oncology

## 2021-11-04 ENCOUNTER — Other Ambulatory Visit: Payer: Self-pay

## 2021-11-04 VITALS — BP 110/70 | HR 59 | Temp 98.1°F | Resp 16 | Ht 66.0 in | Wt 139.0 lb

## 2021-11-04 DIAGNOSIS — Z9189 Other specified personal risk factors, not elsewhere classified: Secondary | ICD-10-CM | POA: Diagnosis not present

## 2021-11-04 DIAGNOSIS — D0512 Intraductal carcinoma in situ of left breast: Secondary | ICD-10-CM | POA: Diagnosis not present

## 2021-11-04 DIAGNOSIS — Z79899 Other long term (current) drug therapy: Secondary | ICD-10-CM | POA: Diagnosis not present

## 2021-11-04 DIAGNOSIS — Z9012 Acquired absence of left breast and nipple: Secondary | ICD-10-CM | POA: Diagnosis not present

## 2021-11-04 DIAGNOSIS — Z79810 Long term (current) use of selective estrogen receptor modulators (SERMs): Secondary | ICD-10-CM | POA: Insufficient documentation

## 2021-11-04 MED ORDER — TAMOXIFEN CITRATE 20 MG PO TABS: 20.0000 mg | ORAL_TABLET | Freq: Every day | ORAL | 2 refills | Status: DC

## 2021-11-07 ENCOUNTER — Telehealth: Payer: Self-pay | Admitting: Hematology and Oncology

## 2021-11-07 NOTE — Telephone Encounter (Signed)
Scheduled appointment per 2/3 los. Left message. Patient will be mailed an updated calendar.

## 2022-08-30 ENCOUNTER — Other Ambulatory Visit: Payer: Self-pay | Admitting: Adult Health

## 2022-08-30 DIAGNOSIS — Z1231 Encounter for screening mammogram for malignant neoplasm of breast: Secondary | ICD-10-CM

## 2022-10-24 ENCOUNTER — Ambulatory Visit
Admission: RE | Admit: 2022-10-24 | Discharge: 2022-10-24 | Disposition: A | Discharge status: Home / Self Care | Payer: Managed Care, Other (non HMO) | Source: Ambulatory Visit | Attending: Adult Health | Admitting: Adult Health

## 2022-10-24 DIAGNOSIS — Z1231 Encounter for screening mammogram for malignant neoplasm of breast: Secondary | ICD-10-CM

## 2022-11-06 ENCOUNTER — Ambulatory Visit: Payer: BC Managed Care – PPO | Admitting: Hematology and Oncology

## 2022-11-07 ENCOUNTER — Ambulatory Visit
Admission: RE | Admit: 2022-11-07 | Discharge: 2022-11-07 | Disposition: A | Discharge status: Home / Self Care | Payer: Managed Care, Other (non HMO) | Source: Ambulatory Visit | Attending: Hematology and Oncology | Admitting: Hematology and Oncology

## 2022-11-07 DIAGNOSIS — Z9189 Other specified personal risk factors, not elsewhere classified: Secondary | ICD-10-CM

## 2022-11-07 DIAGNOSIS — D0512 Intraductal carcinoma in situ of left breast: Secondary | ICD-10-CM

## 2022-11-07 MED ORDER — GADOPICLENOL 0.5 MMOL/ML IV SOLN
6.0000 mL | Freq: Once | INTRAVENOUS | Status: AC | PRN
  Administered 2022-11-07: 6 mL via INTRAVENOUS

## 2022-11-10 NOTE — Progress Notes (Unsigned)
Patient Care Team: Fanny Bien, MD as PCP - General (Family Medicine) Nicholas Lose, MD as Consulting Physician (Hematology and Oncology) Delice Bison, Charlestine Massed, NP as Nurse Practitioner (Hematology and Oncology) Irene Limbo, MD as Consulting Physician (Plastic Surgery) Coralie Keens, MD as Consulting Physician (General Surgery)  DIAGNOSIS: No diagnosis found.  SUMMARY OF ONCOLOGIC HISTORY: Oncology History  Ductal carcinoma in situ (DCIS) of left breast  04/10/2017 Initial Diagnosis   Left breast biopsy 12:00 and 12:30 position: DCIS, grade 1; screening mammogram detected a 9 mm and a 5 mm abnormality in the left breast   04/21/2017 Genetic Testing   BRCA2 c.5937C>G VUS identified on the STAT panel.  The STAT Breast cancer panel offered by [facility] includes sequencing and rearrangement analysis for the following 9 genes:  ATM, BRCA1, BRCA2, CDH1, CHEK2, PALB2, PTEN, STK11 and TP53.   The report date is April 21, 2017.    Testing was reflexed to the larger common hereditary cancer panel and the melanoma cancer panel. This testing was NEGATIVE.  The Hereditary Gene Panel offered by [facility] includes sequencing and/or deletion duplication testing of the following 46 genes: APC, ATM, AXIN2, BARD1, BMPR1A, BRCA1, BRCA2, BRIP1, CDH1, CDKN2A (p14ARF), CDKN2A (p16INK4a), CHEK2, CTNNA1, DICER1, EPCAM (Deletion/duplication testing only), GREM1 (promoter region deletion/duplication testing only), KIT, MEN1, MLH1, MSH2, MSH3, MSH6, MUTYH, NBN, NF1, NHTL1, PALB2, PDGFRA, PMS2, POLD1, POLE, PTEN, RAD50, RAD51C, RAD51D, SDHB, SDHC, SDHD, SMAD4, SMARCA4. STK11, TP53, TSC1, TSC2, and VHL.  The following genes were evaluated for sequence changes only: SDHA and HOXB13 c.251G>A variant only.  The Melanoma panel offered by [facility] includes sequencing and/or deletion duplication testing of the following 12 genes: BAP1, BRCA1, BRCA2, BRIP1, CDK4, CDKN2A (p14ARF), CDKN2A (p16INK4a), MC1R, POT1, PTEN,  RB1, TERT, and TP53.  The following gene was evaluated for sequence changes only: MITF (c.952G>A, p.GLU318Lys variant only).   The report date is April 29, 2017.     06/12/2017 Surgery   Left Mastectomy: Low-grade DCIS 2.7 cm, margins negative, ER + PR +, Tis Nx stage 0   07/2017 -  Anti-estrogen oral therapy   Tamoxifen daily     CHIEF COMPLIANT: Follow-up of left breast DCIS on tamoxifen therapy    INTERVAL HISTORY: Meagan [name] is a 47 y.o. with above-mentioned history of left breast DCIS who underwent a left mastectomy and is currently on tamoxifen therapy. Mammogram on 08/05/2021 showed no evidence of malignancy in the right breast. She presents to the clinic today for annual follow-up.     ALLERGIES:  is allergic to erythromycin and other.  MEDICATIONS:  Current Outpatient Medications  Medication Sig Dispense Refill   cholecalciferol (VITAMIN D3) 25 MCG (1000 UT) tablet Take 3 tablets (3,000 Units total) by mouth daily.     EPINEPHrine 0.3 mg/0.3 mL IJ SOAJ injection See admin instructions.     fexofenadine (ALLEGRA) 180 MG tablet 1 tablet     Lactobacillus Rhamnosus, GG, (RA PROBIOTIC DIGESTIVE CARE) CAPS Culturelle 10 billion cell capsule     tamoxifen (NOLVADEX) 20 MG tablet Take 1 tablet (20 mg total) by mouth daily. 90 tablet 2   Turmeric 500 MG CAPS Take 1 capsule by mouth daily.     No current facility-administered medications for this visit.    PHYSICAL EXAMINATION: ECOG PERFORMANCE STATUS: {CHL ONC ECOG PS:[phone]}  There were no vitals filed for this visit. There were no vitals filed for this visit.  BREAST:*** No palpable masses or nodules in either right or left breasts. No palpable  axillary supraclavicular or infraclavicular adenopathy no breast tenderness or nipple discharge. (exam performed in the presence of a chaperone)  LABORATORY DATA:  I have reviewed the data as listed    Latest Ref Rng & Units 10/14/2020    8:17 AM  CMP  Glucose 70 - 99  mg/dL 96   BUN 6 - 20 mg/dL 13   Creatinine 0.44 - 1.00 mg/dL 0.79   Sodium 135 - 145 mmol/L 140   Potassium 3.5 - 5.1 mmol/L 3.9   Chloride 98 - 111 mmol/L 109   CO2 22 - 32 mmol/L 24   Calcium 8.9 - 10.3 mg/dL 9.1   Total Protein 6.5 - 8.1 g/dL 6.8   Total Bilirubin 0.3 - 1.2 mg/dL 0.5   Alkaline Phos 38 - 126 U/L 54   AST 15 - 41 U/L 18   ALT 0 - 44 U/L 10     Lab Results  Component Value Date   WBC 5.9 10/14/2020   HGB 12.9 10/14/2020   HCT 39.1 10/14/2020   MCV 90.9 10/14/2020   PLT 164 10/14/2020   NEUTROABS 3.0 10/14/2020    ASSESSMENT & PLAN:  No problem-specific Assessment & Plan notes found for this encounter.    No orders of the defined types were placed in this encounter.  The patient has a good understanding of the overall plan. she agrees with it. she will call with any problems that may develop before the next visit here. Total time spent: 30 mins including face to face time and time spent for planning, charting and co-ordination of care   Suzzette Righter, [place] 11/10/22    I Gardiner Coins am acting as a Education administrator for [employer]  ***

## 2022-11-11 NOTE — Assessment & Plan Note (Signed)
06/12/2017: Left Mastectomy: Low-grade DCIS 2.7 cm, margins negative, ER and a pleasant, PR and a pleasant, Tis Nx stage 0   Current treatment: Tamoxifen 20 mg daily x5 years started 07/02/2017 will complete October 2023 Tamoxifen toxicities: Mild hot flashes at night.  Occasional joint stiffness and achiness. She is being very active and exercise regularly.       Breast cancer surveillance: 1. Breast exam 11/11/2022: Benign 2. Breast MRI 11/07/2022: No evidence of cancer in the right breast status post left mastectomy.  Bilateral breast implants appear intact. Plan is to obtain breast MRI every couple of 2 yrs.    3.  Right Mammogram 10/25/22: No evidence of malignancy, breast density category B (previous density was category D)    Return to clinic  in 1 year for follow-up

## 2022-11-13 ENCOUNTER — Inpatient Hospital Stay: Payer: Managed Care, Other (non HMO) | Attending: Hematology and Oncology | Admitting: Hematology and Oncology

## 2022-11-13 VITALS — BP 117/70 | HR 54 | Temp 97.2°F | Resp 16 | Wt 143.2 lb

## 2022-11-13 DIAGNOSIS — Z9012 Acquired absence of left breast and nipple: Secondary | ICD-10-CM | POA: Diagnosis not present

## 2022-11-13 DIAGNOSIS — D0512 Intraductal carcinoma in situ of left breast: Secondary | ICD-10-CM | POA: Diagnosis present

## 2022-11-13 DIAGNOSIS — Z79810 Long term (current) use of selective estrogen receptor modulators (SERMs): Secondary | ICD-10-CM | POA: Diagnosis not present

## 2022-12-20 NOTE — H&P (Signed)
Subjective:    Patient ID: Meagan [name] is a 47 y.o. female.   HPI   5 years post left mastectomy with immediate reconstruction. Wt up 20 lb since time of implant placement, but stable over last 2 years. Reports intermittent tight felling left chest and plan for revision to prepectoral position.   Presented following screening MMG with left breast asymmetries. Diagnostic MMG and [place] with two lesions left breast  12 o'clock 1 cmfn measuring 9 x 1 x 5 mm and at 12:30 2 cmfn measuring 5 x 2 x 6 mm. [place] with normal axilla. Biopsy both revealed low-grade DCIS, ER/PR+. 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 MRI witho significant enhancement in the left breast above the background enhancement. Final pathology 2.7 cm low grade DCIS, <0.1 cm of anterior nipple margin multifocally, 0.1 cm of posterior margin focally. No SLN done. Nipple resection with no evidence malignancy. Completed 5 years tamoxifen.   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   MMG 10/2022 normal. MRI 11/2022 normal    Prior < A cup, Left mastectomy 94 g   Works from home in [employer].    Review of Systems  Objective:  Physical Exam Cardiovascular: normal heart sounds normal rate Pulmonary: clear to auscultation bilateral   Chest soft bilateral Left chest nipple graft with retained height No rippling noted Right volume>left +animation left more than right In supine position no lateral displacment SN to nipple R 18.5 L 18 cm nipple to IMF R 8 L 8 cm   Axillae no masses    Assessment:    DCIS Left breast  S/p Left NSM dual plane TE/ADM (Alloderm) reconstruction S/p left nipple excision Deflation left TE s/p replacement S/p removal left TE, placement silicone implant, right dual plane augmentation, left nipple sharing   Plan:      Plan revision left breast reconstruction with change to prepectoral position, silicone implant exchange and acellular dermis.   Reviewed OP surgery, use additional ADM, drains, limited activities similar to  immediate post mastectomy reconstruction. Risk of this may be increased rippling. She has developed some asymmetry volume likely related in part to weight change. She is satisfied with right breast, no plans for revision to this side. Over left breast, discussed increase volume of left breast implant to try to achieve more symmetry. She understands risk of this is that left volume becomes larger than right or with weight loss she loses volume over right. She sized today and felt the smallest sizer (50 ml) over left caused this side to be larger than right. Reviewed implant catalog- will plan order current implant and size 30 ml larger.     Completed [name] physician patient check list.    Additional risks including but not limited to bleeding, seroma, infection, hematoma, damage to adjacent structures, need for additional procedures, unacceptable cosmetic result, blood clots in legs or lungs reviewed.    Rx for Bactrim Robaxin and oxycodone given.   Natrelle Inspira Smooth Round implants placed bilateral.  RIGHT Moderate Projection, 195 ml, REF [id number]  LEFT Full Projection 295 ml, REF [id number]

## 2023-01-02 ENCOUNTER — Encounter (HOSPITAL_BASED_OUTPATIENT_CLINIC_OR_DEPARTMENT_OTHER): Payer: Self-pay | Admitting: Plastic Surgery

## 2023-01-02 ENCOUNTER — Other Ambulatory Visit: Payer: Self-pay

## 2023-01-08 ENCOUNTER — Encounter (HOSPITAL_BASED_OUTPATIENT_CLINIC_OR_DEPARTMENT_OTHER)
Admission: RE | Admit: 2023-01-08 | Discharge: 2023-01-08 | Disposition: A | Discharge status: Home / Self Care | Payer: Managed Care, Other (non HMO) | Source: Ambulatory Visit | Attending: Plastic Surgery | Admitting: Plastic Surgery

## 2023-01-08 DIAGNOSIS — Z01812 Encounter for preprocedural laboratory examination: Secondary | ICD-10-CM | POA: Diagnosis not present

## 2023-01-08 LAB — POCT PREGNANCY, URINE: Preg Test, Ur: NEGATIVE

## 2023-01-08 MED ORDER — CHLORHEXIDINE GLUCONATE CLOTH 2 % EX PADS: 6.0000 | MEDICATED_PAD | Freq: Once | CUTANEOUS | Status: DC

## 2023-01-08 NOTE — Progress Notes (Signed)

## 2023-01-09 ENCOUNTER — Other Ambulatory Visit: Payer: Self-pay

## 2023-01-09 ENCOUNTER — Ambulatory Visit (HOSPITAL_BASED_OUTPATIENT_CLINIC_OR_DEPARTMENT_OTHER): Payer: Managed Care, Other (non HMO) | Admitting: Anesthesiology

## 2023-01-09 ENCOUNTER — Ambulatory Visit (HOSPITAL_BASED_OUTPATIENT_CLINIC_OR_DEPARTMENT_OTHER)
Admission: RE | Admit: 2023-01-09 | Discharge: 2023-01-09 | Disposition: A | Discharge status: Home / Self Care | Payer: Managed Care, Other (non HMO) | Attending: Plastic Surgery | Admitting: Plastic Surgery

## 2023-01-09 ENCOUNTER — Encounter (HOSPITAL_BASED_OUTPATIENT_CLINIC_OR_DEPARTMENT_OTHER): Payer: Self-pay | Admitting: Plastic Surgery

## 2023-01-09 ENCOUNTER — Encounter (HOSPITAL_BASED_OUTPATIENT_CLINIC_OR_DEPARTMENT_OTHER)
Admission: RE | Disposition: A | Discharge status: Home / Self Care | Payer: Self-pay | Source: Home / Self Care | Attending: Plastic Surgery

## 2023-01-09 DIAGNOSIS — Z86000 Personal history of in-situ neoplasm of breast: Secondary | ICD-10-CM

## 2023-01-09 DIAGNOSIS — Z9012 Acquired absence of left breast and nipple: Secondary | ICD-10-CM | POA: Insufficient documentation

## 2023-01-09 DIAGNOSIS — Z9882 Breast implant status: Secondary | ICD-10-CM | POA: Diagnosis not present

## 2023-01-09 DIAGNOSIS — N651 Disproportion of reconstructed breast: Secondary | ICD-10-CM | POA: Insufficient documentation

## 2023-01-09 DIAGNOSIS — Z01818 Encounter for other preprocedural examination: Secondary | ICD-10-CM

## 2023-01-09 DIAGNOSIS — Z421 Encounter for breast reconstruction following mastectomy: Secondary | ICD-10-CM | POA: Diagnosis not present

## 2023-01-09 HISTORY — PX: BREAST IMPLANT EXCHANGE: SHX6296

## 2023-01-09 SURGERY — REPLACEMENT, IMPLANT, BREAST
Anesthesia: General | Site: Chest | Laterality: Left

## 2023-01-09 MED ORDER — FENTANYL CITRATE (PF) 100 MCG/2ML IJ SOLN: 25.0000 ug | INTRAMUSCULAR | Status: DC | PRN

## 2023-01-09 MED ORDER — OXYCODONE HCL 5 MG PO TABS
5.0000 mg | ORAL_TABLET | Freq: Once | ORAL | Status: AC | PRN
  Administered 2023-01-09: 5 mg via ORAL

## 2023-01-09 MED ORDER — ACETAMINOPHEN 160 MG/5ML PO SOLN: 325.0000 mg | ORAL | Status: DC | PRN

## 2023-01-09 MED ORDER — MEPERIDINE HCL 25 MG/ML IJ SOLN: 6.2500 mg | INTRAMUSCULAR | Status: DC | PRN

## 2023-01-09 MED ORDER — BUPIVACAINE HCL (PF) 0.25 % IJ SOLN
INTRAMUSCULAR | Status: AC
  Filled 2023-01-09: qty 30

## 2023-01-09 MED ORDER — PROPOFOL 10 MG/ML IV BOLUS
INTRAVENOUS | Status: DC | PRN
  Administered 2023-01-09: 150 mg via INTRAVENOUS

## 2023-01-09 MED ORDER — MIDAZOLAM HCL 2 MG/2ML IJ SOLN
INTRAMUSCULAR | Status: AC
  Filled 2023-01-09: qty 2

## 2023-01-09 MED ORDER — CELECOXIB 200 MG PO CAPS
ORAL_CAPSULE | ORAL | Status: AC
  Filled 2023-01-09: qty 1

## 2023-01-09 MED ORDER — DEXAMETHASONE SODIUM PHOSPHATE 4 MG/ML IJ SOLN
INTRAMUSCULAR | Status: DC | PRN
  Administered 2023-01-09: 5 mg via INTRAVENOUS

## 2023-01-09 MED ORDER — CELECOXIB 200 MG PO CAPS: 200.0000 mg | ORAL_CAPSULE | ORAL | Status: DC

## 2023-01-09 MED ORDER — LIDOCAINE-EPINEPHRINE 1 %-1:100000 IJ SOLN
INTRAMUSCULAR | Status: AC
  Filled 2023-01-09: qty 1

## 2023-01-09 MED ORDER — LIDOCAINE HCL (CARDIAC) PF 100 MG/5ML IV SOSY
PREFILLED_SYRINGE | INTRAVENOUS | Status: DC | PRN
  Administered 2023-01-09: 100 mg via INTRAVENOUS

## 2023-01-09 MED ORDER — DEXAMETHASONE SODIUM PHOSPHATE 10 MG/ML IJ SOLN
INTRAMUSCULAR | Status: AC
  Filled 2023-01-09: qty 1

## 2023-01-09 MED ORDER — FENTANYL CITRATE (PF) 100 MCG/2ML IJ SOLN
INTRAMUSCULAR | Status: AC
  Filled 2023-01-09: qty 2

## 2023-01-09 MED ORDER — SUGAMMADEX SODIUM 200 MG/2ML IV SOLN
INTRAVENOUS | Status: DC | PRN
  Administered 2023-01-09: 120 mg via INTRAVENOUS

## 2023-01-09 MED ORDER — ONDANSETRON HCL 4 MG/2ML IJ SOLN: 4.0000 mg | Freq: Once | INTRAMUSCULAR | Status: DC | PRN

## 2023-01-09 MED ORDER — SODIUM CHLORIDE 0.9 % IV SOLN: INTRAVENOUS | Status: DC | PRN

## 2023-01-09 MED ORDER — SCOPOLAMINE 1 MG/3DAYS TD PT72
1.0000 | MEDICATED_PATCH | TRANSDERMAL | Status: DC
  Administered 2023-01-09: 1.5 mg via TRANSDERMAL

## 2023-01-09 MED ORDER — SCOPOLAMINE 1 MG/3DAYS TD PT72
MEDICATED_PATCH | TRANSDERMAL | Status: AC
  Filled 2023-01-09: qty 1

## 2023-01-09 MED ORDER — FENTANYL CITRATE (PF) 100 MCG/2ML IJ SOLN
INTRAMUSCULAR | Status: DC | PRN
  Administered 2023-01-09: 100 ug via INTRAVENOUS

## 2023-01-09 MED ORDER — CELECOXIB 200 MG PO CAPS
200.0000 mg | ORAL_CAPSULE | Freq: Once | ORAL | Status: AC
  Administered 2023-01-09: 200 mg via ORAL

## 2023-01-09 MED ORDER — ACETAMINOPHEN 325 MG PO TABS: 325.0000 mg | ORAL_TABLET | ORAL | Status: DC | PRN

## 2023-01-09 MED ORDER — PROPOFOL 10 MG/ML IV BOLUS
INTRAVENOUS | Status: AC
  Filled 2023-01-09: qty 20

## 2023-01-09 MED ORDER — ROCURONIUM BROMIDE 10 MG/ML (PF) SYRINGE
PREFILLED_SYRINGE | INTRAVENOUS | Status: AC
  Filled 2023-01-09: qty 10

## 2023-01-09 MED ORDER — CEFAZOLIN SODIUM-DEXTROSE 2-4 GM/100ML-% IV SOLN
INTRAVENOUS | Status: AC
  Filled 2023-01-09: qty 100

## 2023-01-09 MED ORDER — LACTATED RINGERS IV SOLN: INTRAVENOUS | Status: DC

## 2023-01-09 MED ORDER — LIDOCAINE 2% (20 MG/ML) 5 ML SYRINGE
INTRAMUSCULAR | Status: AC
  Filled 2023-01-09: qty 5

## 2023-01-09 MED ORDER — ACETAMINOPHEN 500 MG PO TABS
1000.0000 mg | ORAL_TABLET | Freq: Once | ORAL | Status: AC
  Administered 2023-01-09: 1000 mg via ORAL

## 2023-01-09 MED ORDER — GABAPENTIN 300 MG PO CAPS
300.0000 mg | ORAL_CAPSULE | ORAL | Status: AC
  Administered 2023-01-09: 300 mg via ORAL

## 2023-01-09 MED ORDER — HYDROMORPHONE HCL 1 MG/ML IJ SOLN
INTRAMUSCULAR | Status: AC
  Filled 2023-01-09: qty 0.5

## 2023-01-09 MED ORDER — OXYCODONE HCL 5 MG/5ML PO SOLN: 5.0000 mg | Freq: Once | ORAL | Status: AC | PRN

## 2023-01-09 MED ORDER — ONDANSETRON HCL 4 MG/2ML IJ SOLN
INTRAMUSCULAR | Status: AC
  Filled 2023-01-09: qty 2

## 2023-01-09 MED ORDER — ACETAMINOPHEN 500 MG PO TABS: 1000.0000 mg | ORAL_TABLET | ORAL | Status: DC

## 2023-01-09 MED ORDER — EPHEDRINE SULFATE (PRESSORS) 50 MG/ML IJ SOLN
INTRAMUSCULAR | Status: DC | PRN
  Administered 2023-01-09: 15 mg via INTRAVENOUS

## 2023-01-09 MED ORDER — PHENYLEPHRINE HCL (PRESSORS) 10 MG/ML IV SOLN
INTRAVENOUS | Status: DC | PRN
  Administered 2023-01-09 (×4): 160 ug via INTRAVENOUS

## 2023-01-09 MED ORDER — ROCURONIUM BROMIDE 100 MG/10ML IV SOLN
INTRAVENOUS | Status: DC | PRN
  Administered 2023-01-09: 50 mg via INTRAVENOUS

## 2023-01-09 MED ORDER — MIDAZOLAM HCL 5 MG/5ML IJ SOLN
INTRAMUSCULAR | Status: DC | PRN
  Administered 2023-01-09: 2 mg via INTRAVENOUS

## 2023-01-09 MED ORDER — ONDANSETRON HCL 4 MG/2ML IJ SOLN
INTRAMUSCULAR | Status: DC | PRN
  Administered 2023-01-09: 4 mg via INTRAVENOUS

## 2023-01-09 MED ORDER — OXYCODONE HCL 5 MG PO TABS
ORAL_TABLET | ORAL | Status: AC
  Filled 2023-01-09: qty 1

## 2023-01-09 MED ORDER — ACETAMINOPHEN 500 MG PO TABS
ORAL_TABLET | ORAL | Status: AC
  Filled 2023-01-09: qty 2

## 2023-01-09 MED ORDER — BUPIVACAINE HCL (PF) 0.5 % IJ SOLN
INTRAMUSCULAR | Status: DC | PRN
  Administered 2023-01-09: 20 mL

## 2023-01-09 MED ORDER — GABAPENTIN 300 MG PO CAPS
ORAL_CAPSULE | ORAL | Status: AC
  Filled 2023-01-09: qty 1

## 2023-01-09 MED ORDER — CEFAZOLIN SODIUM-DEXTROSE 2-4 GM/100ML-% IV SOLN
2.0000 g | INTRAVENOUS | Status: AC
  Administered 2023-01-09: 2 g via INTRAVENOUS

## 2023-01-09 SURGICAL SUPPLY — 124 items
ADH SKN CLS APL DERMABOND .7 (GAUZE/BANDAGES/DRESSINGS) ×4
ALLOGRAFT PERF 16X20 1.6+/-0.4 (Tissue) ×1 IMPLANT
APL PRP STRL LF DISP 70% ISPRP (MISCELLANEOUS) ×4
APL SKNCLS STERI-STRIP NONHPOA (GAUZE/BANDAGES/DRESSINGS)
BAG DECANTER FOR FLEXI CONT (MISCELLANEOUS) ×2 IMPLANT
BALL CTTN LRG ABS STRL LF (GAUZE/BANDAGES/DRESSINGS)
BENZOIN TINCTURE PRP APPL 2/3 (GAUZE/BANDAGES/DRESSINGS) IMPLANT
BINDER BREAST LRG (GAUZE/BANDAGES/DRESSINGS) ×1 IMPLANT
BINDER BREAST MEDIUM (GAUZE/BANDAGES/DRESSINGS) IMPLANT
BINDER BREAST XLRG (GAUZE/BANDAGES/DRESSINGS) IMPLANT
BINDER BREAST XXLRG (GAUZE/BANDAGES/DRESSINGS) IMPLANT
BLADE CLIPPER SURG (BLADE) IMPLANT
BLADE DERMATOME SS (BLADE) IMPLANT
BLADE SURG 10 STRL SS (BLADE) ×2 IMPLANT
BLADE SURG 15 STRL LF DISP TIS (BLADE) ×2 IMPLANT
BLADE SURG 15 STRL SS (BLADE) ×2
BNDG CMPR 5X3 KNIT ELC UNQ LF (GAUZE/BANDAGES/DRESSINGS)
BNDG CMPR 5X4 CHSV STRCH STRL (GAUZE/BANDAGES/DRESSINGS)
BNDG CMPR 5X62 HK CLSR LF (GAUZE/BANDAGES/DRESSINGS)
BNDG CMPR 6"X 5 YARDS HK CLSR (GAUZE/BANDAGES/DRESSINGS)
BNDG COHESIVE 4X5 TAN STRL LF (GAUZE/BANDAGES/DRESSINGS) ×1 IMPLANT
BNDG ELASTIC 3INX 5YD STR LF (GAUZE/BANDAGES/DRESSINGS) IMPLANT
BNDG ELASTIC 4X5.8 VLCR STR LF (GAUZE/BANDAGES/DRESSINGS) IMPLANT
BNDG ELASTIC 6INX 5YD STR LF (GAUZE/BANDAGES/DRESSINGS) IMPLANT
BNDG GAUZE DERMACEA FLUFF 4 (GAUZE/BANDAGES/DRESSINGS) ×4 IMPLANT
BNDG GZE DERMACEA 4 6PLY (GAUZE/BANDAGES/DRESSINGS) ×4
CANISTER SUCT 1200ML W/VALVE (MISCELLANEOUS) ×2 IMPLANT
CHLORAPREP W/TINT 26 (MISCELLANEOUS) ×3 IMPLANT
COTTONBALL LRG STERILE PKG (GAUZE/BANDAGES/DRESSINGS) IMPLANT
COVER BACK TABLE 60X90IN (DRAPES) ×2 IMPLANT
COVER MAYO STAND STRL (DRAPES) ×4 IMPLANT
DERMABOND ADVANCED .7 DNX12 (GAUZE/BANDAGES/DRESSINGS) ×3 IMPLANT
DERMACARRIERS GRAFT 1 TO 1.5 (DISPOSABLE)
DRAIN CHANNEL 15F RND FF W/TCR (WOUND CARE) ×1 IMPLANT
DRAIN CHANNEL 19F RND (DRAIN) ×1 IMPLANT
DRAPE LAPAROTOMY 100X72 PEDS (DRAPES) IMPLANT
DRAPE SURG 17X23 STRL (DRAPES) IMPLANT
DRAPE TOP ARMCOVERS (MISCELLANEOUS) ×2 IMPLANT
DRAPE U-SHAPE 76X120 STRL (DRAPES) ×2 IMPLANT
DRAPE UTILITY XL STRL (DRAPES) ×3 IMPLANT
DRSG ADAPTIC 3X8 NADH LF (GAUZE/BANDAGES/DRESSINGS) IMPLANT
DRSG EMULSION OIL 3X3 NADH (GAUZE/BANDAGES/DRESSINGS) IMPLANT
DRSG TEGADERM 2-3/8X2-3/4 SM (GAUZE/BANDAGES/DRESSINGS) ×1 IMPLANT
DRSG TEGADERM 4X10 (GAUZE/BANDAGES/DRESSINGS) ×2 IMPLANT
DRSG TEGADERM 4X4.75 (GAUZE/BANDAGES/DRESSINGS) IMPLANT
ELECT BLADE 4.0 EZ CLEAN MEGAD (MISCELLANEOUS)
ELECT BLADE 6.5 EXT (BLADE) IMPLANT
ELECT COATED BLADE 2.86 ST (ELECTRODE) ×2 IMPLANT
ELECT NDL BLADE 2-5/6 (NEEDLE) IMPLANT
ELECT NEEDLE BLADE 2-5/6 (NEEDLE) IMPLANT
ELECT REM PT RETURN 9FT ADLT (ELECTROSURGICAL) ×2
ELECTRODE BLDE 4.0 EZ CLN MEGD (MISCELLANEOUS) ×1 IMPLANT
ELECTRODE REM PT RTRN 9FT ADLT (ELECTROSURGICAL) ×2 IMPLANT
EVACUATOR SILICONE 100CC (DRAIN) ×2 IMPLANT
GAUZE PAD ABD 8X10 STRL (GAUZE/BANDAGES/DRESSINGS) ×3 IMPLANT
GAUZE SPONGE 4X4 12PLY STRL (GAUZE/BANDAGES/DRESSINGS) IMPLANT
GAUZE SPONGE 4X4 12PLY STRL LF (GAUZE/BANDAGES/DRESSINGS) IMPLANT
GAUZE XEROFORM 1X8 LF (GAUZE/BANDAGES/DRESSINGS) IMPLANT
GLOVE BIO SURGEON STRL SZ 6 (GLOVE) ×4 IMPLANT
GLOVE BIO SURGEON STRL SZ 6.5 (GLOVE) IMPLANT
GLOVE BIOGEL PI IND STRL 6.5 (GLOVE) IMPLANT
GOWN STRL REUS W/ TWL LRG LVL3 (GOWN DISPOSABLE) ×4 IMPLANT
GOWN STRL REUS W/TWL LRG LVL3 (GOWN DISPOSABLE) ×4
GRAFT DERMACARRIERS 1 TO 1.5 (DISPOSABLE) IMPLANT
HYDROGEN PEROXIDE 16OZ (MISCELLANEOUS) ×1 IMPLANT
IMPL BREAST FULL RND 295 (Breast) ×1 IMPLANT
IMPL BRST FULL RND 295CC (Breast) ×2 IMPLANT
IMPLANT BREAST GEL 295CC (Breast) ×2 IMPLANT
IV NS 1000ML (IV SOLUTION)
IV NS 1000ML BAXH (IV SOLUTION) IMPLANT
IV NS 500ML (IV SOLUTION)
IV NS 500ML BAXH (IV SOLUTION) ×1 IMPLANT
KIT FILL ASEPTIC TRANSFER (MISCELLANEOUS) ×1 IMPLANT
MARKER SKIN DUAL TIP RULER LAB (MISCELLANEOUS) IMPLANT
NDL HYPO 25X1 1.5 SAFETY (NEEDLE) ×1 IMPLANT
NDL HYPO 27GX1-1/4 (NEEDLE) ×1 IMPLANT
NEEDLE HYPO 25X1 1.5 SAFETY (NEEDLE) ×2 IMPLANT
NEEDLE HYPO 27GX1-1/4 (NEEDLE) IMPLANT
NS IRRIG 1000ML POUR BTL (IV SOLUTION) ×2 IMPLANT
PACK BASIN DAY SURGERY FS (CUSTOM PROCEDURE TRAY) ×2 IMPLANT
PAD CAST 3X4 CTTN HI CHSV (CAST SUPPLIES) IMPLANT
PAD CAST 4YDX4 CTTN HI CHSV (CAST SUPPLIES) IMPLANT
PADDING CAST COTTON 3X4 STRL (CAST SUPPLIES)
PADDING CAST COTTON 4X4 STRL (CAST SUPPLIES)
PENCIL SMOKE EVACUATOR (MISCELLANEOUS) ×2 IMPLANT
PIN SAFETY STERILE (MISCELLANEOUS) ×1 IMPLANT
PUNCH BIOPSY 4MM DISP (MISCELLANEOUS) IMPLANT
SHEET MEDIUM DRAPE 40X70 STRL (DRAPES) ×3 IMPLANT
SLEEVE SCD COMPRESS KNEE MED (STOCKING) ×2 IMPLANT
SPIKE FLUID TRANSFER (MISCELLANEOUS) ×1 IMPLANT
SPONGE T-LAP 18X18 ~~LOC~~+RFID (SPONGE) ×4 IMPLANT
STAPLER VISISTAT 35W (STAPLE) ×2 IMPLANT
STOCKINETTE 4X48 STRL (DRAPES) IMPLANT
STOCKINETTE 6  STRL (DRAPES)
STOCKINETTE 6 STRL (DRAPES) IMPLANT
STOCKINETTE IMPERVIOUS LG (DRAPES) IMPLANT
STRIP CLOSURE SKIN 1/2X4 (GAUZE/BANDAGES/DRESSINGS) ×1 IMPLANT
SURGILUBE 2OZ TUBE FLIPTOP (MISCELLANEOUS) IMPLANT
SUT CHROMIC 4 0 PS 2 18 (SUTURE) IMPLANT
SUT CHROMIC 5 0 P 3 (SUTURE) IMPLANT
SUT ETHILON 2 0 FS 18 (SUTURE) ×1 IMPLANT
SUT MNCRL AB 4-0 PS2 18 (SUTURE) ×2 IMPLANT
SUT PDS AB 2-0 CT2 27 (SUTURE) ×2 IMPLANT
SUT SILK 3 0 SH CR/8 (SUTURE) IMPLANT
SUT SILK 4 0 PS 2 (SUTURE) IMPLANT
SUT VIC AB 3-0 PS1 18 (SUTURE)
SUT VIC AB 3-0 PS1 18XBRD (SUTURE) IMPLANT
SUT VIC AB 3-0 SH 27 (SUTURE) ×2
SUT VIC AB 3-0 SH 27X BRD (SUTURE) ×2 IMPLANT
SUT VIC AB 5-0 P-3 18X BRD (SUTURE) IMPLANT
SUT VIC AB 5-0 P3 18 (SUTURE)
SUT VICRYL 0 CT-2 (SUTURE) IMPLANT
SUT VICRYL 4-0 PS2 18IN ABS (SUTURE) ×2 IMPLANT
SUT VLOC 180 0 24IN GS25 (SUTURE) ×2 IMPLANT
SYR 50ML LL SCALE MARK (SYRINGE) ×2 IMPLANT
SYR BULB EAR ULCER 3OZ GRN STR (SYRINGE) IMPLANT
SYR BULB IRRIG 60ML STRL (SYRINGE) ×2 IMPLANT
SYR CONTROL 10ML LL (SYRINGE) ×2 IMPLANT
TAPE MEASURE VINYL STERILE (MISCELLANEOUS) ×1 IMPLANT
TOWEL GREEN STERILE FF (TOWEL DISPOSABLE) ×4 IMPLANT
TRAY DSU PREP LF (CUSTOM PROCEDURE TRAY) ×2 IMPLANT
TUBE CONNECTING 20X1/4 (TUBING) ×3 IMPLANT
UNDERPAD 30X36 HEAVY ABSORB (UNDERPADS AND DIAPERS) ×4 IMPLANT
YANKAUER SUCT BULB TIP NO VENT (SUCTIONS) ×2 IMPLANT

## 2023-01-09 NOTE — Transfer of Care (Signed)
Immediate Anesthesia Transfer of Care Note  Patient: Meagan [name]  Procedure(s) Performed: REVISION BREAST RECONSTRUCTION WITH SILICONE IMPLANT EXCHANGE, ACELLULAR DERMIS TO LEFT CHEST (Left: Chest)  Patient Location: PACU  Anesthesia Type:General  Level of Consciousness: awake, alert , and patient cooperative  Airway & Oxygen Therapy: Patient Spontanous Breathing and Patient connected to face mask oxygen  Post-op Assessment: Report given to RN and Post -op Vital signs reviewed and stable  Post vital signs: Reviewed and stable  Last Vitals:  Vitals Value Taken Time  BP 120/70 01/09/23 1218  Temp    Pulse 87 01/09/23 1219  Resp 15 01/09/23 1219  SpO2 100 % 01/09/23 1219  Vitals shown include unvalidated device data.  Last Pain:  Vitals:   01/09/23 0925  TempSrc: Oral  PainSc: 0-No pain      Patients Stated Pain Goal: 4 (01/09/23 0925)  Complications: No notable events documented.

## 2023-01-09 NOTE — Anesthesia Preprocedure Evaluation (Signed)
Anesthesia Evaluation  Patient identified by MRN, date of birth, ID band Patient awake    Reviewed: Allergy & Precautions, NPO status , Patient's Chart, lab work & pertinent test results  History of Anesthesia Complications Negative for: history of anesthetic complications  Airway Mallampati: I  TM Distance: >3 FB Neck ROM: Full    Dental no notable dental hx. (+) Dental Advisory Given   Pulmonary asthma , pneumonia   Pulmonary exam normal        Cardiovascular negative cardio ROS Normal cardiovascular exam     Neuro/Psych negative neurological ROS     GI/Hepatic negative GI ROS, Neg liver ROS,,,  Endo/Other  negative endocrine ROS    Renal/GU negative Renal ROS     Musculoskeletal negative musculoskeletal ROS (+)    Abdominal   Peds  Hematology negative hematology ROS (+)   Anesthesia Other Findings Day of surgery medications reviewed with the patient.  Reproductive/Obstetrics                              Anesthesia Physical Anesthesia Plan  ASA: 2  Anesthesia Plan: General   Post-op Pain Management: Tylenol PO (pre-op)* and Celebrex PO (pre-op)*   Induction: Intravenous  PONV Risk Score and Plan: 4 or greater and Ondansetron, Dexamethasone and Scopolamine patch - Pre-op  Airway Management Planned: Oral ETT  Additional Equipment: None  Intra-op Plan:   Post-operative Plan: Extubation in OR  Informed Consent: I have reviewed the patients History and Physical, chart, labs and discussed the procedure including the risks, benefits and alternatives for the proposed anesthesia with the patient or authorized representative who has indicated his/her understanding and acceptance.     Dental advisory given  Plan Discussed with: Anesthesiologist and CRNA  Anesthesia Plan Comments:          Anesthesia Quick Evaluation

## 2023-01-09 NOTE — Anesthesia Procedure Notes (Signed)
Procedure Name: Intubation Date/Time: 01/09/2023 12:21 PM  Performed by: Karen Kitchens, CRNAPre-anesthesia Checklist: Patient identified, Emergency Drugs available, Suction available and Patient being monitored Patient Re-evaluated:Patient Re-evaluated prior to induction Oxygen Delivery Method: Circle system utilized Preoxygenation: Pre-oxygenation with 100% oxygen Induction Type: IV induction Ventilation: Mask ventilation without difficulty Laryngoscope Size: Mac and 4 Grade View: Grade I Tube type: Oral Tube size: 7.5 mm Number of attempts: 1 Airway Equipment and Method: Stylet and Oral airway Placement Confirmation: ETT inserted through vocal cords under direct vision, positive ETCO2, breath sounds checked- equal and bilateral and CO2 detector Secured at: 22 cm Tube secured with: Tape Dental Injury: Teeth and Oropharynx as per pre-operative assessment

## 2023-01-09 NOTE — Interval H&P Note (Signed)
History and Physical Interval Note:  01/09/2023 9:41 AM  Meagan [name]  has presented today for surgery, with the diagnosis of history DCIS, acquired absence breast.  The various methods of treatment have been discussed with the patient and family. After consideration of risks, benefits and other options for treatment, the patient has consented to  revision left breast reconstruction with silicone implant exchange acellular dermis to left chest as a surgical intervention.  The patient's history has been reviewed, patient examined, no change in status, stable for surgery.  I have reviewed the patient's chart and labs.  Questions were answered to the patient's satisfaction.     Irean Hong Cotton Beckley

## 2023-01-09 NOTE — Op Note (Signed)
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 Revision left breast reconstruction with silicone implant exchange 2. Acellular dermis (Alloderm) to left chest  SURGEON: Glenna Fellows MD MBA  ASSISTANT: none  ANESTHESIA:  General.   EBL: 30 ml  COMPLICATIONS: None immediate.   INDICATIONS FOR PROCEDURE:  The patient, Meagan [name], is a 47 y.o. female born on 1976/05/01, is here for revision left breast reconstruction from dual plane to prepectoral position.   FINDINGS: Removed intact smooth round silicone implant. Placed Natrelle Inspira smooth round Full Projection 295 ml implant REF [id number]  DESCRIPTION OF PROCEDURE:  The patient's operative site was marked with the patient in the preoperative area. The patient was taken to the operating room. SCDs were placed and IV antibiotics were given. The patient's operative site was prepped and draped in a sterile fashion. A time out was performed and all information was confirmed to be correct. Incision made in left inframammary fold scar and carried through superficial fascia and capsule. Intact implant removed. Caudal border pectoralis muscle identified and muscle dissected free from mastectomy flap. Muscle inset to chest wall with 0 V lock suture. Local anesthetic infiltrated. Cavity irrigated with saline solution containing Ancef, gentamicin, and Betadine. Hemostasis ensured. 19 Fr JP place along lateral superior and medial borders cavity. Drain secured to skin with 2-0 nylon suture.    Acellular dermis prepared. This was wrapped around implant on back table with 2-0 PDS spanning sutures. Implant acellular dermis construct placed in left chest cavity. Acellular dermis secured to chest wall along inframammary fold with 0  V lock suture. Closure completed with 3-0 vicryl in superficial fascia, 4-0 vicryl in dermis, and 4-0 monocryl subcuticular skin closure. Dermabond applied to incisions. Patient brought to upright sitting position and mastectomy flap redraped so that nipple was symmetric with opposite breast. Tegaderms applied over left chest. Patient returned to supine position. Dry dressing and breast binder applied.   The patient was allowed to wake from anesthesia, extubated and taken to the recovery room in satisfactory condition.   SPECIMENS: none  DRAINS: 19 Fr JP in left subcutaneous position  Glenna Fellows, MD [facility] Plastic & Reconstructive Surgery  Office/ physician access line after hours [phone]

## 2023-01-09 NOTE — Discharge Instructions (Signed)
You may have Tylenol again after 3:30pm today.   You may have Ibuprofen/NSAIDS again after 5:30pm today.   Post Anesthesia Home Care Instructions  Activity: Get plenty of rest for the remainder of the day. A responsible individual must stay with you for 24 hours following the procedure.  For the next 24 hours, DO NOT: -Drive a car -Advertising copywriter -Drink alcoholic beverages -Take any medication unless instructed by your physician -Make any legal decisions or sign important papers.  Meals: Start with liquid foods such as gelatin or soup. Progress to regular foods as tolerated. Avoid greasy, spicy, heavy foods. If nausea and/or vomiting occur, drink only clear liquids until the nausea and/or vomiting subsides. Call your physician if vomiting continues.  Special Instructions/Symptoms: Your throat may feel dry or sore from the anesthesia or the breathing tube placed in your throat during surgery. If this causes discomfort, gargle with warm salt water. The discomfort should disappear within 24 hours.  If you had a scopolamine patch placed behind your ear for the management of post- operative nausea and/or vomiting:  1. The medication in the patch is effective for 72 hours, after which it should be removed.  Wrap patch in a tissue and discard in the trash. Wash hands thoroughly with soap and water. 2. You may remove the patch earlier than 72 hours if you experience unpleasant side effects which may include dry mouth, dizziness or visual disturbances. 3. Avoid touching the patch. Wash your hands with soap and water after contact with the patch.

## 2023-01-10 ENCOUNTER — Encounter (HOSPITAL_BASED_OUTPATIENT_CLINIC_OR_DEPARTMENT_OTHER): Payer: Self-pay | Admitting: Plastic Surgery

## 2023-01-10 NOTE — Anesthesia Postprocedure Evaluation (Signed)
Anesthesia Post Note  Patient: GIULIANA BELINSKI  Procedure(s) Performed: REVISION BREAST RECONSTRUCTION WITH SILICONE IMPLANT EXCHANGE, ACELLULAR DERMIS TO LEFT CHEST (Left: Chest)     Patient location during evaluation: PACU Anesthesia Type: General Level of consciousness: awake and alert Pain management: pain level controlled Vital Signs Assessment: post-procedure vital signs reviewed and stable Respiratory status: spontaneous breathing, nonlabored ventilation, respiratory function stable and patient connected to nasal cannula oxygen Cardiovascular status: blood pressure returned to baseline and stable Postop Assessment: no apparent nausea or vomiting Anesthetic complications: no   No notable events documented.  Last Vitals:  Vitals:   01/09/23 1300 01/09/23 1315  BP: 114/71 120/63  Pulse: 70 70  Resp: 14 16  Temp:  (!) 36.4 C  SpO2: 99% 96%    Last Pain:  Vitals:   01/10/23 1453  TempSrc:   PainSc: 1    Pain Goal: Patients Stated Pain Goal: 4 (01/09/23 1315)                 Adyan Palau

## 2023-01-15 ENCOUNTER — Other Ambulatory Visit: Payer: Self-pay

## 2023-01-15 ENCOUNTER — Emergency Department (HOSPITAL_COMMUNITY): Payer: Managed Care, Other (non HMO)

## 2023-01-15 ENCOUNTER — Encounter (HOSPITAL_COMMUNITY): Payer: Self-pay

## 2023-01-15 ENCOUNTER — Emergency Department (HOSPITAL_COMMUNITY)
Admission: EM | Admit: 2023-01-15 | Discharge: 2023-01-15 | Disposition: A | Discharge status: Home / Self Care | Payer: Managed Care, Other (non HMO) | Attending: Emergency Medicine | Admitting: Emergency Medicine

## 2023-01-15 DIAGNOSIS — R072 Precordial pain: Secondary | ICD-10-CM | POA: Insufficient documentation

## 2023-01-15 DIAGNOSIS — R079 Chest pain, unspecified: Secondary | ICD-10-CM

## 2023-01-15 DIAGNOSIS — G8918 Other acute postprocedural pain: Secondary | ICD-10-CM | POA: Diagnosis not present

## 2023-01-15 LAB — CBC WITH DIFFERENTIAL/PLATELET
Abs Immature Granulocytes: 0.01 10*3/uL (ref 0.00–0.07)
Basophils Absolute: 0.1 10*3/uL (ref 0.0–0.1)
Basophils Relative: 2 %
Eosinophils Absolute: 0.3 10*3/uL (ref 0.0–0.5)
Eosinophils Relative: 5 %
HCT: 41 % (ref 36.0–46.0)
Hemoglobin: 13.4 g/dL (ref 12.0–15.0)
Immature Granulocytes: 0 %
Lymphocytes Relative: 22 %
Lymphs Abs: 1.2 10*3/uL (ref 0.7–4.0)
MCH: 29.6 pg (ref 26.0–34.0)
MCHC: 32.7 g/dL (ref 30.0–36.0)
MCV: 90.7 fL (ref 80.0–100.0)
Monocytes Absolute: 0.4 10*3/uL (ref 0.1–1.0)
Monocytes Relative: 7 %
Neutro Abs: 3.3 10*3/uL (ref 1.7–7.7)
Neutrophils Relative %: 64 %
Platelets: 161 10*3/uL (ref 150–400)
RBC: 4.52 MIL/uL (ref 3.87–5.11)
RDW: 13.1 % (ref 11.5–15.5)
WBC: 5.1 10*3/uL (ref 4.0–10.5)
nRBC: 0 % (ref 0.0–0.2)

## 2023-01-15 LAB — BASIC METABOLIC PANEL
Anion gap: 10 (ref 5–15)
BUN: 10 mg/dL (ref 6–20)
CO2: 22 mmol/L (ref 22–32)
Calcium: 9 mg/dL (ref 8.9–10.3)
Chloride: 104 mmol/L (ref 98–111)
Creatinine, Ser: 0.83 mg/dL (ref 0.44–1.00)
GFR, Estimated: >60 mL/min (ref >60)
Glucose, Bld: 100 mg/dL — ABNORMAL HIGH (ref 70–99)
Potassium: 4.1 mmol/L (ref 3.5–5.1)
Sodium: 136 mmol/L (ref 135–145)

## 2023-01-15 LAB — I-STAT BETA HCG BLOOD, ED (MC, WL, AP ONLY): I-stat hCG, quantitative: <5 m[IU]/mL (ref <5)

## 2023-01-15 LAB — TROPONIN I (HIGH SENSITIVITY): Troponin I (High Sensitivity): <2 ng/L (ref <18)

## 2023-01-15 MED ORDER — SODIUM CHLORIDE (PF) 0.9 % IJ SOLN
INTRAMUSCULAR | Status: AC
  Filled 2023-01-15: qty 50

## 2023-01-15 MED ORDER — IOHEXOL 350 MG/ML SOLN
75.0000 mL | Freq: Once | INTRAVENOUS | Status: AC | PRN
  Administered 2023-01-15: 75 mL via INTRAVENOUS

## 2023-01-15 MED ORDER — KETOROLAC TROMETHAMINE 30 MG/ML IJ SOLN
30.0000 mg | Freq: Once | INTRAMUSCULAR | Status: AC
  Administered 2023-01-15: 30 mg via INTRAVENOUS
  Filled 2023-01-15: qty 1

## 2023-01-15 MED ORDER — DIPHENHYDRAMINE HCL 50 MG/ML IJ SOLN
25.0000 mg | Freq: Once | INTRAMUSCULAR | Status: AC
  Administered 2023-01-15: 25 mg via INTRAVENOUS
  Filled 2023-01-15: qty 1

## 2023-01-15 NOTE — ED Triage Notes (Signed)
Pt had breast reconstruction on Tuesday wiht Dr Leta Baptist. Pt still has drain in place. Pt c/o sternal pain when sitting up right, relieved with pressure or laying down. Advised to come in by dr's office.

## 2023-01-15 NOTE — ED Notes (Signed)
Pt. Went to the bathroom when she was getting back into the bed she screamed out pt. Said  her cheat is pain really bad I went and got the CN she assisted the pt. CN request me to repeat EKG

## 2023-01-15 NOTE — Discharge Instructions (Signed)
Please follow-up with your surgeon in the office in 2 days as scheduled for a follow-up appointment.  You can use Tylenol 650 mg every 6 hours, as well as over-the-counter ibuprofen, 600 mg every 6 hours, as needed for pain.  For more severe pain you can use the oxycodone that was prescribed by your surgeon.  You should return to the ER if you experience dizziness, difficulty breathing, loss of consciousness, or significantly worsening pain, or have any other emergency medical concerns.

## 2023-01-15 NOTE — ED Provider Notes (Signed)
[place] EMERGENCY DEPARTMENT AT [facility] Provider Note   CSN: [id number] Arrival date & time: 01/15/23  0830     History  Chief Complaint  Patient presents with   Chest Pain   Post-op Problem    Meagan [name] is a 47 y.o. female presented to the ED with complaining of substernal chest pain approximate 1 week after an operation.  The patient had plastic surgery breast reconstruction performed approximately 1 week ago by Dr Glenna Fellows, and reports he had been doing well after surgery, taking Bactrim as prophylaxis daily, and has a drain in place, but woke up with a sensation of sharp substernal pain, which appeared to be worse when standing completely upright, and relieved when lying at a flexed position.  She denies shortness of breath.  Denies fevers or chills.  She only took Tylenol at home, no NSAIDs.  She contacted her plastic surgeons office who advised her to come to the ER for evaluation  HPI     Home Medications Prior to Admission medications   Medication Sig Start Date End Date Taking? Authorizing Provider  cholecalciferol (VITAMIN D3) 25 MCG (1000 UT) tablet Take 3 tablets (3,000 Units total) by mouth daily. 10/04/18   Serena Croissant, MD  EPINEPHrine 0.3 mg/0.3 mL IJ SOAJ injection See admin instructions.    [provider]  fexofenadine (ALLEGRA) 180 MG tablet 1 tablet    [provider]  Lactobacillus Rhamnosus, GG, (RA PROBIOTIC DIGESTIVE CARE) CAPS Culturelle 10 billion cell capsule    [provider]  Turmeric 500 MG CAPS Take 1 capsule by mouth daily. 10/04/18   Serena Croissant, MD      Allergies    Erythromycin and Iodine    Review of Systems   Review of Systems  Physical Exam Updated Vital Signs BP 118/63   Pulse 65   Temp (!) 97.4 F (36.3 C) (Oral)   Resp 13   Ht  (1.676 m)   Wt 63.5 kg   LMP 12/30/2022   SpO2 100%   BMI 22.60 kg/m  Physical Exam Constitutional:      General: She is not in acute  distress. HENT:     Head: Normocephalic and atraumatic.  Eyes:     Conjunctiva/sclera: Conjunctivae normal.     Pupils: Pupils are equal, round, and reactive to light.  Cardiovascular:     Rate and Rhythm: Normal rate and regular rhythm.  Pulmonary:     Effort: Pulmonary effort is normal. No respiratory distress.  Abdominal:     General: There is no distension.     Tenderness: There is no abdominal tenderness.  Musculoskeletal:     Comments: Left breast surgical reconstruction site scar wounds appear clean, nonerythematous, no purulent drainage, Subcutaneous drain with scant amount of output No reproducible tenderness of the sternal notch or the chest wall on exam  Skin:    General: Skin is warm and dry.  Neurological:     General: No focal deficit present.     Mental Status: She is alert. Mental status is at baseline.  Psychiatric:        Mood and Affect: Mood normal.        Behavior: Behavior normal.     ED Results / Procedures / Treatments   Labs (all labs ordered are listed, but only abnormal results are displayed) Labs Reviewed  BASIC METABOLIC PANEL - Abnormal; Notable for the following components:      Result Value  Glucose, Bld 100 (*)    All other components within normal limits  CBC WITH DIFFERENTIAL/PLATELET  I-STAT BETA HCG BLOOD, ED ([facility], [facility], [facility] ONLY)  TROPONIN I (HIGH SENSITIVITY)    EKG EKG Interpretation  Date/Time:  Monday January 15 2023 10:58:30 EDT Ventricular Rate:  70 PR Interval:  121 QRS Duration: 100 QT Interval:  572 QTC Calculation: 618 R Axis:   83 Text Interpretation: Sinus rhythm LAE, consider biatrial enlargement Borderline T wave abnormalities No sig change in Qt interval from earlier tracing, no sig changes Confirmed by Alvester Chou [id number]) on 01/15/2023 11:00:22 AM  Radiology CT Angio Chest PE W and/or Wo Contrast  Result Date: 01/15/2023 CLINICAL DATA:  Chest pain. EXAM: CT ANGIOGRAPHY CHEST WITH CONTRAST TECHNIQUE:  Multidetector CT imaging of the chest was performed using the standard protocol during bolus administration of intravenous contrast. Multiplanar CT image reconstructions and MIPs were obtained to evaluate the vascular anatomy. RADIATION DOSE REDUCTION: This exam was performed according to the departmental dose-optimization program which includes automated exposure control, adjustment of the mA and/or kV according to patient size and/or use of iterative reconstruction technique. CONTRAST:  18mL OMNIPAQUE IOHEXOL 350 MG/ML SOLN COMPARISON:  None Available. FINDINGS: Cardiovascular: Satisfactory opacification of the pulmonary arteries to the segmental level. No evidence of pulmonary embolism. Normal heart size. No pericardial effusion. Mediastinum/Nodes: No enlarged mediastinal, hilar, or axillary lymph nodes. Thyroid gland, trachea, and esophagus demonstrate no significant findings. Lungs/Pleura: Lungs are clear. No pleural effusion or pneumothorax. Upper Abdomen: No acute abnormality. Musculoskeletal: No fracture or other bony abnormality is noted. Bilateral breast implants are noted, with surgical drain still present superior to the left breast implant. Review of the MIP images confirms the above findings. IMPRESSION: No definite evidence of pulmonary embolus. Bilateral breast implants are noted with surgical drain still noted adjacent to left breast implant. Electronically Signed   By: Lupita Raider M.D.   On: 01/15/2023 11:44    Procedures Procedures    Medications Ordered in ED Medications  ketorolac (TORADOL) 30 MG/ML injection 30 mg (30 mg Intravenous Given 01/15/23 0939)  iohexol (OMNIPAQUE) 350 MG/ML injection 75 mL (75 mLs Intravenous Contrast Given 01/15/23 1111)  diphenhydrAMINE (BENADRYL) injection 25 mg (25 mg Intravenous Given 01/15/23 1143)    ED Course/ Medical Decision Making/ A&P Clinical Course as of 01/15/23 1223  Mon Jan 15, 2023  1100 Patient had been complaining of sudden  recurring chest pain, repeat EKG per my review shows no acute ischemic changes or findings, QT interval grossly appears unchanged from prior tracing, suspect ecg machine interpretation may be unaccurate with reported prolonged QTc [MT]  1153 Following the patient's contrast study she returned and had redness and swelling around her left eye as well as a small hive on her left hand.  She does report that she has allergies to several environmental issues including cat dander, believes that she has had iodine contrast scans in the past and never had a reaction.  However in this clinical setting I do think it is reasonable to list iodine as an allergy [MT]  1215 I spoke to Dr Leta Baptist from plastic surgery in the clinic -we reviewed the workup and she feels that the patient could recently follow-up this week for neck scheduled outpatient follow-up, and feels that this may be simply postoperative pain, or stitch related pain, which I think clinically does fit with his contacts.  Do not see anything more dangerous at this time.  Patient will be evaluated until  she appears stable from an allergic reaction perspective, and that could be discharged home [MT]    Clinical Course User Index [MT] Khyli Swaim, Kermit Balo, MD                             Medical Decision Making Amount and/or Complexity of Data Reviewed Labs: ordered. Radiology: ordered. ECG/medicine tests: ordered.  Risk Prescription drug management.   Patient is here with substernal chest pain following an operation approximately 1 week ago.  Otherwise she is well-appearing.  No signs of sepsis per initial presentation.  I have a fairly low clinical suspicion for cellulitis or skin infection, there is difficulty exclude the possibility of a deeper underlying infection.  More likely there may be some irritating seroma or perhaps a drain placement that is causing irritation, or lymphadenopathy, with her description of pain.  However because she is  postoperative and has a history of malignancy I do think a PE workup would be reasonable.  The CT PE scan was ordered, after discussion with the patient and her husband.  I personally reviewed the patient's labs, EKG, CT scan, notable for no emergent findings.  Blood test and CT scan unremarkable.  I also reviewed her external records including her [facility] plastic surgeon evaluation postoperative  IV Toradol was given for pain.  On reassessment the patient some initial improvement of her pain, but then the pain appeared to return.  She does not want any stronger medications at this time.  IV Benadryl was also given for possible allergic reaction.  On reassessment the patient's itching and symptoms had significantly improved.        Final Clinical Impression(s) / ED Diagnoses Final diagnoses:  Chest pain, unspecified type  Post-operative pain    Rx / DC Orders ED Discharge Orders     None         Terald Sleeper, MD 01/15/23 1223

## 2023-01-15 NOTE — ED Notes (Signed)
Pt ambulated to bathroom, had sharp chest pain, lower sternum area. Some relief noted after lying flat. VS updated, EKG repeated and given to EDP. Pt then taken or her CT angio.

## 2023-04-30 ENCOUNTER — Other Ambulatory Visit: Payer: Self-pay | Admitting: Plastic Surgery

## 2023-04-30 ENCOUNTER — Encounter: Payer: Self-pay | Admitting: Plastic Surgery

## 2023-04-30 DIAGNOSIS — Z86000 Personal history of in-situ neoplasm of breast: Secondary | ICD-10-CM

## 2023-04-30 DIAGNOSIS — N6320 Unspecified lump in the left breast, unspecified quadrant: Secondary | ICD-10-CM

## 2023-05-08 ENCOUNTER — Ambulatory Visit
Admission: RE | Admit: 2023-05-08 | Discharge: 2023-05-08 | Disposition: A | Discharge status: Home / Self Care | Payer: Managed Care, Other (non HMO) | Source: Ambulatory Visit | Attending: Plastic Surgery

## 2023-05-08 ENCOUNTER — Other Ambulatory Visit: Payer: Self-pay | Admitting: Plastic Surgery

## 2023-05-08 DIAGNOSIS — N6320 Unspecified lump in the left breast, unspecified quadrant: Secondary | ICD-10-CM

## 2023-11-06 ENCOUNTER — Other Ambulatory Visit: Payer: Managed Care, Other (non HMO)

## 2023-11-13 ENCOUNTER — Telehealth: Payer: Self-pay | Admitting: Adult Health

## 2023-11-13 NOTE — Telephone Encounter (Signed)
Patient called to reschedule 2/12 appointment.

## 2023-11-14 ENCOUNTER — Inpatient Hospital Stay: Payer: Self-pay | Admitting: Adult Health

## 2023-11-30 ENCOUNTER — Encounter: Payer: Self-pay | Admitting: Adult Health

## 2023-11-30 ENCOUNTER — Inpatient Hospital Stay: Payer: Managed Care, Other (non HMO) | Attending: Adult Health | Admitting: Adult Health

## 2023-11-30 VITALS — BP 114/60 | HR 72 | Temp 97.6°F | Resp 18 | Wt 141.0 lb

## 2023-11-30 DIAGNOSIS — Z79810 Long term (current) use of selective estrogen receptor modulators (SERMs): Secondary | ICD-10-CM | POA: Insufficient documentation

## 2023-11-30 DIAGNOSIS — D0512 Intraductal carcinoma in situ of left breast: Secondary | ICD-10-CM | POA: Diagnosis present

## 2023-11-30 DIAGNOSIS — Z9012 Acquired absence of left breast and nipple: Secondary | ICD-10-CM | POA: Insufficient documentation

## 2023-11-30 NOTE — Progress Notes (Signed)
 [facility] Cancer Follow up:    Meagan Moccasin, MD [address] [place] [address]   DIAGNOSIS:  Cancer Staging  Ductal carcinoma in situ (DCIS) of left breast Staging form: Breast, AJCC 8th Edition - Clinical: Stage 0 (cTis (DCIS), cN0, cM0) - Signed by Loa Socks, NP on 06/27/2017 - Pathologic: Stage 0 (pTis (DCIS), pN0, cM0, ER+, PR+) - Signed by Loa Socks, NP on 06/27/2017   SUMMARY OF ONCOLOGIC HISTORY: Oncology History  Ductal carcinoma in situ (DCIS) of left breast  04/10/2017 Initial Diagnosis   Left breast biopsy 12:00 and 12:30 position: DCIS, grade 1; screening mammogram detected a 9 mm and a 5 mm abnormality in the left breast   04/21/2017 Genetic Testing   BRCA2 c.5937C>G VUS identified on the STAT panel.  The STAT Breast cancer panel offered by [facility] includes sequencing and rearrangement analysis for the following 9 genes:  ATM, BRCA1, BRCA2, CDH1, CHEK2, PALB2, PTEN, STK11 and TP53.   The report date is April 21, 2017.    Testing was reflexed to the larger common hereditary cancer panel and the melanoma cancer panel. This testing was NEGATIVE.  The Hereditary Gene Panel offered by [facility] includes sequencing and/or deletion duplication testing of the following 46 genes: APC, ATM, AXIN2, BARD1, BMPR1A, BRCA1, BRCA2, BRIP1, CDH1, CDKN2A (p14ARF), CDKN2A (p16INK4a), CHEK2, CTNNA1, DICER1, EPCAM (Deletion/duplication testing only), GREM1 (promoter region deletion/duplication testing only), KIT, MEN1, MLH1, MSH2, MSH3, MSH6, MUTYH, NBN, NF1, NHTL1, PALB2, PDGFRA, PMS2, POLD1, POLE, PTEN, RAD50, RAD51C, RAD51D, SDHB, SDHC, SDHD, SMAD4, SMARCA4. STK11, TP53, TSC1, TSC2, and VHL.  The following genes were evaluated for sequence changes only: SDHA and HOXB13 c.251G>A variant only.  The Melanoma panel offered by [facility] includes sequencing and/or deletion duplication testing of the following 12 genes: BAP1, BRCA1, BRCA2, BRIP1, CDK4,  CDKN2A (p14ARF), CDKN2A (p16INK4a), MC1R, POT1, PTEN, RB1, TERT, and TP53.  The following gene was evaluated for sequence changes only: MITF (c.952G>A, p.GLU318Lys variant only).   The report date is April 29, 2017.     06/12/2017 Surgery   Left Mastectomy: Low-grade DCIS 2.7 cm, margins negative, ER + PR +, Tis Nx stage 0   07/2017 -  Anti-estrogen oral therapy   Tamoxifen daily     CURRENT THERAPY: observation  INTERVAL HISTORY:  Discussed the use of AI scribe software for clinical note transcription with the patient, who gave verbal consent to proceed.  Meagan [name] 48 y.o. female  with a history of ductal carcinoma in situ (DCIS) in 2018, presents for a routine follow-up. She has been on tamoxifen since her diagnosis but recently stopped the medication. She reports a change in her breast implants from subpectoral to prepectoral last April, which has alleviated the previous discomfort she was experiencing. She denies any new breast pain or discomfort.  She also mentions a palpable lump on the side of her breast, which was evaluated with a diagnostic mammogram and diagnosed as fat necrosis.  Meagan [name] also discusses her lifestyle, including her exercise routine, which includes weights, Pilates, and walking. She has been focusing on a diet with less processed food and meat, which she feels has improved her overall health.  She also mentions occasional night sweats but denies any other menopausal symptoms. She has not had a recent blood workup but is considering getting one to check her vitamin D levels and other parameters.   Patient Active Problem List   Diagnosis Date Noted   Allergic rhinitis 11/26/2020  Allergic rhinitis due to animal (cat) (dog) hair and dander 11/26/2020   Allergic rhinitis due to pollen 11/26/2020   Chronic allergic conjunctivitis 11/26/2020   History of malignant neoplasm of breast 07/02/2020   Bulging of lumbar intervertebral disc 12/26/2018   Low back pain  09/02/2018   History of ductal carcinoma in situ (DCIS) of breast 10/18/2017   Acquired absence of left breast 06/20/2017   Genetic testing 04/26/2017   Family history of breast cancer    Family history of melanoma    Family history of colon cancer    Ductal carcinoma in situ (DCIS) of left breast 04/13/2017    is allergic to cat dander, erythromycin, and iodine.  MEDICAL HISTORY: Past Medical History:  Diagnosis Date   Asthma    childhood astha, no problems now   Breast cancer (HCC)    Cancer (HCC) 05/2017   left breast DCIS   Family history of breast cancer    Family history of colon cancer    Family history of melanoma    Pneumonia    as a child    SURGICAL HISTORY: Past Surgical History:  Procedure Laterality Date   APPENDECTOMY  10/02/2001   AUGMENTATION MAMMAPLASTY Right 2018   silicone   BREAST ENHANCEMENT SURGERY Right 10/01/2017   Procedure: RIGHT AUGMENTATION FOR SYMMETRY;  Surgeon: Glenna Fellows, MD;  Location: [facility];  Service: Plastics;  Laterality: Right;   BREAST IMPLANT EXCHANGE Left 01/09/2023   Procedure: REVISION BREAST RECONSTRUCTION WITH SILICONE IMPLANT EXCHANGE, ACELLULAR DERMIS TO LEFT CHEST;  Surgeon: Glenna Fellows, MD;  Location: [facility];  Service: Plastics;  Laterality: Left;   BREAST RECONSTRUCTION Left 10/01/2017   Procedure: NIPPLE SHARING RECONSTRUCTION LEFT CHEST;  Surgeon: Glenna Fellows, MD;  Location: [facility];  Service: Plastics;  Laterality: Left;   BREAST RECONSTRUCTION WITH PLACEMENT OF TISSUE EXPANDER AND FLEX HD (ACELLULAR HYDRATED DERMIS) Left 06/12/2017   Procedure: LEFT BREAST RECONSTRUCTION WITH PLACEMENT OF TISSUE EXPANDERAND ALLODERM;  Surgeon: Glenna Fellows, MD;  Location: [facility];  Service: Plastics;  Laterality: Left;   EXCISION OF BREAST LESION Left 07/06/2017   Procedure: EXCISION OF LEFT BREAST NIPPLE;  Surgeon: Abigail Miyamoto, MD;   Location: [facility];  Service: General;  Laterality: Left;   HYSTEROSCOPY N/A 11/30/2017   Procedure: HYSTEROSCOPY;  Surgeon: Waynard Reeds, MD;  Location: [facility];  Service: Gynecology;  Laterality: N/A;   MASTECTOMY Left 2018   NIPPLE SPARING MASTECTOMY Left 06/12/2017   Procedure: LEFT BREAST NIPPLE SPARING MASTECTOMY;  Surgeon: Abigail Miyamoto, MD;  Location: [facility];  Service: General;  Laterality: Left;   REMOVAL OF TISSUE EXPANDER AND PLACEMENT OF IMPLANT Left 07/17/2017   Procedure: REMOVAL OF TISSUE EXPANDER AND PLACEMENT OF IMPLANT;  Surgeon: Glenna Fellows, MD;  Location: [facility];  Service: Plastics;  Laterality: Left;   REMOVAL OF TISSUE EXPANDER AND PLACEMENT OF IMPLANT Left 10/01/2017   Procedure: REMOVAL OF TISSUE EXPANDER AND PLACEMENT OF LEFT  IMPLANT;  Surgeon: Glenna Fellows, MD;  Location: [facility];  Service: Plastics;  Laterality: Left;   WISDOM TOOTH EXTRACTION      SOCIAL HISTORY: Social History   Socioeconomic History   Marital status: Married    Spouse name: Not on file   Number of children: Not on file   Years of education: Not on file   Highest education level: Not on file  Occupational History   Not on file  Tobacco Use   Smoking status: Never   Smokeless tobacco: Never  Vaping Use   Vaping status: Never Used  Substance and Sexual Activity   Alcohol use: Yes    Comment: social   Drug use: No   Sexual activity: Yes    Birth control/protection: Condom  Other Topics Concern   Not on file  Social History Narrative   Not on file   Social Drivers of Health   Financial Resource Strain: Not on file  Food Insecurity: Not on file  Transportation Needs: Not on file  Physical Activity: Not on file  Stress: Not on file  Social Connections: Not on file  Intimate Partner Violence: Not on file    FAMILY HISTORY: Family History  Problem Relation Age of Onset   Cervical cancer  Mother 42       adenocarcioma   Skin cancer Mother        BCC's   Melanoma Father        dx in his 4s   Parkinson's disease Maternal Grandmother    Colon cancer Maternal Grandfather        dx in his 61s   Skin cancer Paternal Grandfather    Breast cancer Cousin        mother's maternal cousin - x2, one dx at 20, another in her 47s   Breast cancer Cousin        mothers paternal first cousin dx in her 16s-60s, and again in her 24s with possible DCIS    Review of Systems  Constitutional:  Negative for appetite change, chills, fatigue, fever and unexpected weight change.  HENT:   Negative for hearing loss, lump/mass and trouble swallowing.   Eyes:  Negative for eye problems and icterus.  Respiratory:  Negative for chest tightness, cough and shortness of breath.   Cardiovascular:  Negative for chest pain, leg swelling and palpitations.  Gastrointestinal:  Negative for abdominal distention, abdominal pain, constipation, diarrhea, nausea and vomiting.  Endocrine: Negative for hot flashes.  Genitourinary:  Negative for difficulty urinating.   Musculoskeletal:  Negative for arthralgias.  Skin:  Negative for itching and rash.  Neurological:  Negative for dizziness, extremity weakness, headaches and numbness.  Hematological:  Negative for adenopathy. Does not bruise/bleed easily.  Psychiatric/Behavioral:  Negative for depression. The patient is not nervous/anxious.       PHYSICAL EXAMINATION   Onc Performance Status - 11/30/23 1100       KPS SCALE   KPS % SCORE Able to carry on normal activity, minor s/s of disease             Vitals:   11/30/23 1111  BP: 114/60  Pulse: 72  Resp: 18  Temp: 97.6 F (36.4 C)  SpO2: 100%    Physical Exam Constitutional:      General: She is not in acute distress.    Appearance: Normal appearance. She is not toxic-appearing.  HENT:     Head: Normocephalic and atraumatic.     Mouth/Throat:     Mouth: Mucous membranes are moist.      Pharynx: Oropharynx is clear. No oropharyngeal exudate or posterior oropharyngeal erythema.  Eyes:     General: No scleral icterus. Cardiovascular:     Rate and Rhythm: Normal rate and regular rhythm.     Pulses: Normal pulses.     Heart sounds: Normal heart sounds.  Pulmonary:     Effort: Pulmonary effort is normal.     Breath sounds: Normal breath sounds.  Chest:     Comments: S/p left mastectomy and reconstruction, no sign of local recurrence, right breast benign Abdominal:     General: Abdomen is flat. Bowel sounds are normal. There is no distension.     Palpations: Abdomen is soft.     Tenderness: There is no abdominal tenderness.  Musculoskeletal:        General: No swelling.     Cervical back: Neck supple.  Lymphadenopathy:     Cervical: No cervical adenopathy.     Upper Body:     Right upper body: No supraclavicular or axillary adenopathy.     Left upper body: No supraclavicular or axillary adenopathy.  Skin:    General: Skin is warm and dry.     Findings: No rash.  Neurological:     General: No focal deficit present.     Mental Status: She is alert.  Psychiatric:        Mood and Affect: Mood normal.        Behavior: Behavior normal.       ASSESSMENT and THERAPY PLAN:   Ductal carcinoma in situ (DCIS) of left breast Meagan [name] is a 48 year old woman with left breast stage 0 DCIS diagnosed in 2018 s/p mastectomy and Tamoxifen that she completed in 2023.   Breast Cancer History Patient had ductal carcinoma in situ (DCIS) in 2018 and was on Tamoxifen until recently. No new issues or concerns reported. No signs of recurrence on physical examination. -Continue regular follow-ups and monitoring.  Breast Implant Adjustment Patient had pre-pectoral implants adjusted in April 2024. Reports improvement in discomfort and no current issues. -Continue regular follow-ups and monitoring.  Fat Necrosis Patient had a diagnostic mammogram for a palpable lump, which was identified  as fat necrosis. A follow-up imaging appointment is scheduled for next week. -Continue monitoring and follow-up imaging as scheduled.  General Health Maintenance Patient maintains a healthy lifestyle with regular exercise and a balanced diet. She is considering getting a full blood workup, including vitamin D levels, through her primary care provider. -Encourage continued healthy lifestyle habits. -Advise patient to share blood work results for review, particularly vitamin D and B12 levels.  RTC in 1 year for continued Long term surveillance.   All questions were answered. The patient knows to call the clinic with any problems, questions or concerns. We can certainly see the patient much sooner if necessary.  Total encounter time:20 minutes*in face-to-face visit time, chart review, lab review, care coordination, order entry, and documentation of the encounter time.    Lillard Anes, NP 11/30/23 12:27 PM Medical Oncology and Hematology [facility] [address] [place], [place] [address] Tel. [phone]    Fax. [phone]  *Total Encounter Time as defined by the Centers for Medicare and Medicaid Services includes, in addition to the face-to-face time of a patient visit (documented in the note above) non-face-to-face time: obtaining and reviewing outside history, ordering and reviewing medications, tests or procedures, care coordination (communications with other health care professionals or caregivers) and documentation in the medical record.

## 2023-11-30 NOTE — Assessment & Plan Note (Signed)
 Meagan [name] is a 48 year old woman with left breast stage 0 DCIS diagnosed in 2018 s/p mastectomy and Tamoxifen that she completed in 2023.   Breast Cancer History Patient had ductal carcinoma in situ (DCIS) in 2018 and was on Tamoxifen until recently. No new issues or concerns reported. No signs of recurrence on physical examination. -Continue regular follow-ups and monitoring.  Breast Implant Adjustment Patient had pre-pectoral implants adjusted in April 2024. Reports improvement in discomfort and no current issues. -Continue regular follow-ups and monitoring.  Fat Necrosis Patient had a diagnostic mammogram for a palpable lump, which was identified as fat necrosis. A follow-up imaging appointment is scheduled for next week. -Continue monitoring and follow-up imaging as scheduled.  General Health Maintenance Patient maintains a healthy lifestyle with regular exercise and a balanced diet. She is considering getting a full blood workup, including vitamin D levels, through her primary care provider. -Encourage continued healthy lifestyle habits. -Advise patient to share blood work results for review, particularly vitamin D and B12 levels.  RTC in 1 year for continued Long term surveillance.

## 2023-12-04 ENCOUNTER — Other Ambulatory Visit: Payer: Self-pay | Admitting: Plastic Surgery

## 2023-12-04 ENCOUNTER — Ambulatory Visit
Admission: RE | Admit: 2023-12-04 | Discharge: 2023-12-04 | Disposition: A | Discharge status: Home / Self Care | Payer: Managed Care, Other (non HMO) | Source: Ambulatory Visit | Attending: Plastic Surgery | Admitting: Plastic Surgery

## 2023-12-04 DIAGNOSIS — N6320 Unspecified lump in the left breast, unspecified quadrant: Secondary | ICD-10-CM

## 2024-12-01 ENCOUNTER — Inpatient Hospital Stay: Payer: Managed Care, Other (non HMO) | Admitting: Adult Health
